# Patient Record
Sex: Female | Born: 1950 | Race: White | Hispanic: No | Marital: Married
Health system: Midwestern US, Community
[De-identification: ages and names within clinical notes are randomized; demographics above are authoritative.]

## PROBLEM LIST (undated history)

## (undated) DIAGNOSIS — F329 Major depressive disorder, single episode, unspecified: Secondary | ICD-10-CM

## (undated) DIAGNOSIS — I350 Nonrheumatic aortic (valve) stenosis: Secondary | ICD-10-CM

## (undated) DIAGNOSIS — K7581 Nonalcoholic steatohepatitis (NASH): Secondary | ICD-10-CM

## (undated) DIAGNOSIS — D509 Iron deficiency anemia, unspecified: Secondary | ICD-10-CM

## (undated) DIAGNOSIS — D61818 Other pancytopenia: Secondary | ICD-10-CM

## (undated) DIAGNOSIS — F32A Depression, unspecified: Secondary | ICD-10-CM

## (undated) DIAGNOSIS — R51 Headache: Secondary | ICD-10-CM

## (undated) DIAGNOSIS — I10 Essential (primary) hypertension: Secondary | ICD-10-CM

## (undated) DIAGNOSIS — M1990 Unspecified osteoarthritis, unspecified site: Secondary | ICD-10-CM

## (undated) DIAGNOSIS — E7800 Pure hypercholesterolemia, unspecified: Secondary | ICD-10-CM

## (undated) DIAGNOSIS — I8500 Esophageal varices without bleeding: Secondary | ICD-10-CM

## (undated) DIAGNOSIS — K766 Portal hypertension: Secondary | ICD-10-CM

## (undated) DIAGNOSIS — K5790 Diverticulosis of intestine, part unspecified, without perforation or abscess without bleeding: Secondary | ICD-10-CM

## (undated) DIAGNOSIS — E119 Type 2 diabetes mellitus without complications: Secondary | ICD-10-CM

## (undated) HISTORY — DX: Type 2 diabetes mellitus without complications: E11.9

## (undated) HISTORY — DX: Iron deficiency anemia, unspecified: D50.9

## (undated) HISTORY — PX: CHOLECYSTECTOMY: SHX55

## (undated) HISTORY — DX: Essential (primary) hypertension: I10

## (undated) HISTORY — DX: Nonalcoholic steatohepatitis (NASH): K75.81

## (undated) HISTORY — DX: Nonrheumatic aortic (valve) stenosis: I35.0

## (undated) HISTORY — PX: BREAST EXCISIONAL BIOPSY: SUR124

## (undated) HISTORY — DX: Other pancytopenia: D61.818

---

## 1990-04-01 HISTORY — PX: TONSILLECTOMY AND ADENOIDECTOMY: SUR1326

## 1991-04-02 HISTORY — PX: ABDOMINAL HYSTERECTOMY: SHX81

## 1996-04-01 HISTORY — PX: NASAL SEPTUM SURGERY: SHX37

## 2003-04-02 HISTORY — PX: SIGMOIDECTOMY: SHX176

## 2004-04-01 HISTORY — PX: HERNIA REPAIR: SHX51

## 2007-10-13 ENCOUNTER — Encounter: Admission: RE | Admit: 2007-10-13 | Discharge: 2007-10-13 | Payer: Self-pay | Admitting: Internal Medicine

## 2008-03-01 ENCOUNTER — Encounter: Admission: RE | Admit: 2008-03-01 | Discharge: 2008-03-01 | Payer: Self-pay | Admitting: Internal Medicine

## 2008-04-01 DIAGNOSIS — K7581 Nonalcoholic steatohepatitis (NASH): Secondary | ICD-10-CM

## 2008-04-01 HISTORY — DX: Nonalcoholic steatohepatitis (NASH): K75.81

## 2008-08-16 ENCOUNTER — Encounter: Admission: RE | Admit: 2008-08-16 | Discharge: 2008-08-16 | Payer: Self-pay | Admitting: Gastroenterology

## 2009-01-18 ENCOUNTER — Encounter: Admission: RE | Admit: 2009-01-18 | Discharge: 2009-01-18 | Payer: Self-pay | Admitting: Internal Medicine

## 2009-03-27 ENCOUNTER — Encounter: Admission: RE | Admit: 2009-03-27 | Discharge: 2009-03-27 | Payer: Self-pay | Admitting: Internal Medicine

## 2009-12-28 ENCOUNTER — Ambulatory Visit (HOSPITAL_COMMUNITY): Admission: RE | Admit: 2009-12-28 | Discharge: 2009-12-28 | Payer: Self-pay | Admitting: Gastroenterology

## 2010-12-06 ENCOUNTER — Encounter (INDEPENDENT_AMBULATORY_CARE_PROVIDER_SITE_OTHER): Payer: Self-pay | Admitting: Ophthalmology

## 2011-01-14 ENCOUNTER — Encounter (INDEPENDENT_AMBULATORY_CARE_PROVIDER_SITE_OTHER): Payer: Self-pay | Admitting: Ophthalmology

## 2011-02-27 ENCOUNTER — Other Ambulatory Visit: Payer: Self-pay | Admitting: Internal Medicine

## 2011-02-27 DIAGNOSIS — Z1231 Encounter for screening mammogram for malignant neoplasm of breast: Secondary | ICD-10-CM

## 2011-03-04 ENCOUNTER — Ambulatory Visit (INDEPENDENT_AMBULATORY_CARE_PROVIDER_SITE_OTHER): Payer: Managed Care, Other (non HMO) | Admitting: Ophthalmology

## 2011-03-04 DIAGNOSIS — H43819 Vitreous degeneration, unspecified eye: Secondary | ICD-10-CM

## 2011-03-04 DIAGNOSIS — E11319 Type 2 diabetes mellitus with unspecified diabetic retinopathy without macular edema: Secondary | ICD-10-CM

## 2011-03-04 DIAGNOSIS — H2510 Age-related nuclear cataract, unspecified eye: Secondary | ICD-10-CM

## 2011-03-04 DIAGNOSIS — E1139 Type 2 diabetes mellitus with other diabetic ophthalmic complication: Secondary | ICD-10-CM

## 2011-03-22 ENCOUNTER — Ambulatory Visit
Admission: RE | Admit: 2011-03-22 | Discharge: 2011-03-22 | Disposition: A | Discharge status: Home / Self Care | Payer: Private Health Insurance - Indemnity | Source: Ambulatory Visit | Attending: Internal Medicine | Admitting: Internal Medicine

## 2011-03-22 DIAGNOSIS — Z1231 Encounter for screening mammogram for malignant neoplasm of breast: Secondary | ICD-10-CM

## 2011-04-02 HISTORY — PX: COLONOSCOPY: SHX174

## 2011-10-17 ENCOUNTER — Other Ambulatory Visit: Payer: Self-pay | Admitting: Gastroenterology

## 2011-11-29 ENCOUNTER — Telehealth: Payer: Self-pay | Admitting: Hematology and Oncology

## 2011-11-29 NOTE — Telephone Encounter (Signed)
S/W pt in re NP appt 9/11 @ 1 W/Dr. Dalene Carrow  Referring Dr. Renford Dills Dx- Anemia NP packet mailed out.

## 2011-11-29 NOTE — Telephone Encounter (Signed)
C/D on 8/30 for appt 9/11 °

## 2011-12-03 ENCOUNTER — Telehealth: Payer: Self-pay | Admitting: Hematology and Oncology

## 2011-12-03 NOTE — Telephone Encounter (Signed)
Pt called and r/s appt due to another appt.

## 2011-12-10 ENCOUNTER — Encounter (HOSPITAL_COMMUNITY): Payer: Self-pay

## 2011-12-11 ENCOUNTER — Ambulatory Visit: Payer: Private Health Insurance - Indemnity

## 2011-12-11 ENCOUNTER — Ambulatory Visit: Payer: Private Health Insurance - Indemnity | Admitting: Oncology

## 2011-12-11 ENCOUNTER — Ambulatory Visit: Payer: Private Health Insurance - Indemnity | Admitting: Hematology and Oncology

## 2011-12-11 ENCOUNTER — Other Ambulatory Visit: Payer: Private Health Insurance - Indemnity | Admitting: Lab

## 2011-12-12 ENCOUNTER — Ambulatory Visit (HOSPITAL_COMMUNITY): Payer: Managed Care, Other (non HMO) | Admitting: Certified Registered Nurse Anesthetist

## 2011-12-12 ENCOUNTER — Encounter (HOSPITAL_COMMUNITY): Payer: Self-pay | Admitting: Certified Registered Nurse Anesthetist

## 2011-12-12 ENCOUNTER — Ambulatory Visit (HOSPITAL_COMMUNITY)
Admission: RE | Admit: 2011-12-12 | Discharge: 2011-12-12 | Disposition: A | Discharge status: Home / Self Care | Payer: Managed Care, Other (non HMO) | Source: Ambulatory Visit | Attending: Gastroenterology | Admitting: Gastroenterology

## 2011-12-12 ENCOUNTER — Encounter (HOSPITAL_COMMUNITY): Payer: Self-pay

## 2011-12-12 ENCOUNTER — Encounter (HOSPITAL_COMMUNITY)
Admission: RE | Disposition: A | Discharge status: Home / Self Care | Payer: Self-pay | Source: Ambulatory Visit | Attending: Gastroenterology

## 2011-12-12 DIAGNOSIS — D509 Iron deficiency anemia, unspecified: Secondary | ICD-10-CM | POA: Insufficient documentation

## 2011-12-12 DIAGNOSIS — E7800 Pure hypercholesterolemia, unspecified: Secondary | ICD-10-CM | POA: Insufficient documentation

## 2011-12-12 DIAGNOSIS — I8510 Secondary esophageal varices without bleeding: Secondary | ICD-10-CM | POA: Insufficient documentation

## 2011-12-12 DIAGNOSIS — Z79899 Other long term (current) drug therapy: Secondary | ICD-10-CM | POA: Insufficient documentation

## 2011-12-12 DIAGNOSIS — I10 Essential (primary) hypertension: Secondary | ICD-10-CM | POA: Insufficient documentation

## 2011-12-12 DIAGNOSIS — E119 Type 2 diabetes mellitus without complications: Secondary | ICD-10-CM | POA: Insufficient documentation

## 2011-12-12 DIAGNOSIS — D126 Benign neoplasm of colon, unspecified: Secondary | ICD-10-CM | POA: Insufficient documentation

## 2011-12-12 DIAGNOSIS — Z794 Long term (current) use of insulin: Secondary | ICD-10-CM | POA: Insufficient documentation

## 2011-12-12 DIAGNOSIS — K7460 Unspecified cirrhosis of liver: Secondary | ICD-10-CM | POA: Insufficient documentation

## 2011-12-12 DIAGNOSIS — M899 Disorder of bone, unspecified: Secondary | ICD-10-CM | POA: Insufficient documentation

## 2011-12-12 HISTORY — DX: Major depressive disorder, single episode, unspecified: F32.9

## 2011-12-12 HISTORY — DX: Unspecified osteoarthritis, unspecified site: M19.90

## 2011-12-12 HISTORY — DX: Headache: R51

## 2011-12-12 HISTORY — DX: Depression, unspecified: F32.A

## 2011-12-12 LAB — GLUCOSE, CAPILLARY
Glucose-Capillary: 88 mg/dL (ref 70–99)
Glucose-Capillary: 66 mg/dL — ABNORMAL LOW (ref 70–99)
Glucose-Capillary: 106 mg/dL — ABNORMAL HIGH (ref 70–99)

## 2011-12-12 SURGERY — COLONOSCOPY WITH PROPOFOL
Anesthesia: Monitor Anesthesia Care

## 2011-12-12 MED ORDER — ONDANSETRON HCL 4 MG/2ML IJ SOLN
INTRAMUSCULAR | Status: DC | PRN
  Administered 2011-12-12: 4 mg via INTRAVENOUS

## 2011-12-12 MED ORDER — PROPOFOL 10 MG/ML IV EMUL
INTRAVENOUS | Status: DC | PRN
  Administered 2011-12-12: 100 ug/kg/min via INTRAVENOUS

## 2011-12-12 MED ORDER — SODIUM CHLORIDE 0.9 % IV SOLN: INTRAVENOUS | Status: DC

## 2011-12-12 MED ORDER — LACTATED RINGERS IV SOLN
INTRAVENOUS | Status: DC
  Administered 2011-12-12: 1000 mL via INTRAVENOUS

## 2011-12-12 MED ORDER — LIDOCAINE HCL (CARDIAC) 20 MG/ML IV SOLN
INTRAVENOUS | Status: DC | PRN
  Administered 2011-12-12: 100 mg via INTRAVENOUS

## 2011-12-12 MED ORDER — MIDAZOLAM HCL 5 MG/5ML IJ SOLN
INTRAMUSCULAR | Status: DC | PRN
  Administered 2011-12-12: 1 mg via INTRAVENOUS

## 2011-12-12 MED ORDER — KETAMINE HCL 10 MG/ML IJ SOLN
INTRAMUSCULAR | Status: DC | PRN
  Administered 2011-12-12: 20 mg via INTRAVENOUS

## 2011-12-12 MED ORDER — FENTANYL CITRATE 0.05 MG/ML IJ SOLN
INTRAMUSCULAR | Status: DC | PRN
  Administered 2011-12-12 (×2): 50 ug via INTRAVENOUS

## 2011-12-12 SURGICAL SUPPLY — 24 items

## 2011-12-12 NOTE — Op Note (Signed)
Procedure: Surveillance colonoscopy.  Endoscopist: Danise Edge  Premedication: Propofol administered by anesthesia  Procedure: The patient was placed in the left lateral decubitus position. Anal inspection and digital rectal exam were normal. The Pentax pediatric colonoscope was introduced into the rectum and easily advanced to the cecum. Colonic preparation for the exam today was good.  Rectum. Normal. Sigmoid colon and descending colon. Remote sigmoid colectomy for diverticular disease. From the distal descending colon a 1 cm pedunculated polyp was removed with the hot snare. From the proximal descending colon a 5 mm sessile polyp was removed the hot snare. Splenic flexure. Normal. Transverse colon. From the proximal transverse colon a 4 mm sessile polyp was removed with the cold snare. Hepatic flexure. Normal. Ascending colon. Normal. Cecum and ileocecal valve. Normal.  Assessment: A 4 mm polyp was removed from the proximal transverse colon, a 5 mm polyp was removed from the proximal descending colon, a 1 cm pedunculated polyp was removed from the distal descending colon.  Recommendations: Schedule surveillance colonoscopy in 3 years.

## 2011-12-12 NOTE — H&P (Signed)
  Problem: History of colon polyps. Surveillance colonoscopy is scheduled today.  History: The patient is a 61 year old female who underwent a colonoscopy with removal of adenomatous colon polyps with  villous component in 2010. She is scheduled to undergo a surveillance colonoscopy today.  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 She takes nadalol to prevent variceal bleeding. She has never had a documented variceal bleed.  Medication allergies: None.  Past medical and surgical history: Type 2 diabetes mellitus. Hypertension. Hypercholesterolemia. Osteopenia. Iron deficiency anemia. Sigmoid colectomy to treat diverticular disease. Vitamin D. deficiency. History of colon polyps. 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 Sigmoid colectomy. Cholecystectomy. Hysterectomy. Tonsillectomy. Umbilical hernia surgery. Sinus surgery.  Habits: The patient does not smoke cigarettes or consume alcohol.  Chronic medications: Fosamax. NovoLog insulin. Lantus insulin. Lactulose. Protonix. Phenergan. Crestor. Diovan. Effexor.  Exam: The patient is alert and lying comfortably on the endoscopy stretcher. Sclera are nonicteric. Cardiac exam reveals a regular rhythm. Lungs are clear to auscultation. Abdomen is soft, flat, and nontender to palpation in all quadrants.  Plan: Proceed with surveillance colonoscopy with polypectomy to prevent colon cancer.

## 2011-12-12 NOTE — Transfer of Care (Signed)
Immediate Anesthesia Transfer of Care Note  Patient: Jillian Sanders  Procedure(s) Performed: Procedure(s) (LRB): COLONOSCOPY WITH PROPOFOL (N/A)  Patient Location: PACU  Anesthesia Type: MAC  Level of Consciousness: sedated, patient cooperative and responds to stimulaton  Airway & Oxygen Therapy: Patient Spontanous Breathing and Patient connected to face mask oxgen  Post-op Assessment: Report given to PACU RN and Post -op Vital signs reviewed and stable  Post vital signs: Reviewed and stable  Complications: No apparent anesthesia complications

## 2011-12-12 NOTE — Progress Notes (Signed)
Patient given graham crackers and regular coke for low blood sugar prior to discharge blood sugar was 106.

## 2011-12-12 NOTE — Preoperative (Signed)
Beta Blockers   Reason not to administer Beta Blockers:nadolol Taken at 12-12-11 at 2200

## 2011-12-12 NOTE — Anesthesia Postprocedure Evaluation (Signed)
  Anesthesia Post-op Note  Patient: Jillian Sanders  Procedure(s) Performed: Procedure(s) (LRB): COLONOSCOPY WITH PROPOFOL (N/A)  Patient Location: PACU  Anesthesia Type: MAC  Level of Consciousness: awake and alert   Airway and Oxygen Therapy: Patient Spontanous Breathing  Post-op Pain: mild  Post-op Assessment: Post-op Vital signs reviewed, Patient's Cardiovascular Status Stable, Respiratory Function Stable, Patent Airway and No signs of Nausea or vomiting  Post-op Vital Signs: stable  Complications: No apparent anesthesia complications

## 2011-12-12 NOTE — Anesthesia Preprocedure Evaluation (Addendum)
Anesthesia Evaluation  Patient identified by MRN, date of birth, ID band Patient awake    Reviewed: Allergy & Precautions, H&P , NPO status , Patient's Chart, lab work & pertinent test results  History of Anesthesia Complications (+) AWARENESS UNDER ANESTHESIA  Airway Mallampati: II TM Distance: <3 FB Neck ROM: Full    Dental No notable dental hx.    Pulmonary neg pulmonary ROS,  breath sounds clear to auscultation  Pulmonary exam normal       Cardiovascular hypertension, Pt. on medications negative cardio ROS  Rhythm:Regular Rate:Normal     Neuro/Psych negative neurological ROS  negative psych ROS   GI/Hepatic negative GI ROS, Neg liver ROS, GERD-  Medicated,  Endo/Other  negative endocrine ROSdiabetes, Insulin Dependent  Renal/GU negative Renal ROS  negative genitourinary   Musculoskeletal negative musculoskeletal ROS (+)   Abdominal   Peds negative pediatric ROS (+)  Hematology negative hematology ROS (+)   Anesthesia Other Findings   Reproductive/Obstetrics negative OB ROS                         Anesthesia Physical Anesthesia Plan  ASA: III  Anesthesia Plan: MAC   Post-op Pain Management:    Induction: Intravenous  Airway Management Planned: Nasal Cannula  Additional Equipment:   Intra-op Plan:   Post-operative Plan:   Informed Consent: I have reviewed the patients History and Physical, chart, labs and discussed the procedure including the risks, benefits and alternatives for the proposed anesthesia with the patient or authorized representative who has indicated his/her understanding and acceptance.     Plan Discussed with: CRNA and Surgeon  Anesthesia Plan Comments:         Anesthesia Quick Evaluation

## 2011-12-17 ENCOUNTER — Ambulatory Visit (HOSPITAL_BASED_OUTPATIENT_CLINIC_OR_DEPARTMENT_OTHER): Payer: Managed Care, Other (non HMO) | Admitting: Hematology and Oncology

## 2011-12-17 ENCOUNTER — Telehealth: Payer: Self-pay | Admitting: *Deleted

## 2011-12-17 ENCOUNTER — Ambulatory Visit (HOSPITAL_BASED_OUTPATIENT_CLINIC_OR_DEPARTMENT_OTHER): Payer: Managed Care, Other (non HMO)

## 2011-12-17 ENCOUNTER — Telehealth: Payer: Self-pay | Admitting: Hematology and Oncology

## 2011-12-17 ENCOUNTER — Ambulatory Visit: Payer: Managed Care, Other (non HMO)

## 2011-12-17 ENCOUNTER — Encounter: Payer: Self-pay | Admitting: Hematology and Oncology

## 2011-12-17 VITALS — BP 145/73 | HR 91 | Temp 98.2°F | Resp 20 | Ht 65.5 in | Wt 184.4 lb

## 2011-12-17 DIAGNOSIS — D539 Nutritional anemia, unspecified: Secondary | ICD-10-CM

## 2011-12-17 DIAGNOSIS — D509 Iron deficiency anemia, unspecified: Secondary | ICD-10-CM | POA: Insufficient documentation

## 2011-12-17 DIAGNOSIS — K7689 Other specified diseases of liver: Secondary | ICD-10-CM

## 2011-12-17 DIAGNOSIS — R5383 Other fatigue: Secondary | ICD-10-CM

## 2011-12-17 LAB — CBC WITH DIFFERENTIAL/PLATELET
BASO%: 0.7 % (ref 0.0–2.0)
Basophils Absolute: 0 10*3/uL (ref 0.0–0.1)
EOS%: 3.2 % (ref 0.0–7.0)
HCT: 33.5 % — ABNORMAL LOW (ref 34.8–46.6)
HGB: 11.5 g/dL — ABNORMAL LOW (ref 11.6–15.9)
MCH: 30.2 pg (ref 25.1–34.0)
MONO#: 0.2 10*3/uL (ref 0.1–0.9)
NEUT#: 1.6 10*3/uL (ref 1.5–6.5)
NEUT%: 58.4 % (ref 38.4–76.8)
RDW: 14 % (ref 11.2–14.5)
WBC: 2.8 10*3/uL — ABNORMAL LOW (ref 3.9–10.3)
lymph#: 0.9 10*3/uL (ref 0.9–3.3)

## 2011-12-17 LAB — COMPREHENSIVE METABOLIC PANEL (CC13)
Alkaline Phosphatase: 179 U/L — ABNORMAL HIGH (ref 40–150)
BUN: 8 mg/dL (ref 7.0–26.0)
Creatinine: 0.8 mg/dL (ref 0.6–1.1)
Glucose: 194 mg/dl — ABNORMAL HIGH (ref 70–99)
Sodium: 139 mEq/L (ref 136–145)
Total Bilirubin: 1.5 mg/dL — ABNORMAL HIGH (ref 0.20–1.20)
Total Protein: 6.8 g/dL (ref 6.4–8.3)

## 2011-12-17 LAB — URINALYSIS, MICROSCOPIC - CHCC
Ketones: NEGATIVE mg/dL
Leukocyte Esterase: NEGATIVE
Protein: NEGATIVE mg/dL
RBC / HPF: NEGATIVE (ref 0–2)

## 2011-12-17 LAB — TECHNOLOGIST REVIEW

## 2011-12-17 NOTE — Progress Notes (Signed)
Dr.   Renford Dills       -     Primary. Dr.   Danise Edge       -     GI Dr.   [name]     -      Herpetologist  @   [facility].  [facility]   In    [place].   [phone].  Cell    Phone      [phone].

## 2011-12-17 NOTE — Progress Notes (Signed)
Checked in new pt with no financial concerns. °

## 2011-12-17 NOTE — Telephone Encounter (Signed)
Dr. Georges Mouse pt advised and will receive 9.18 with appt.

## 2011-12-17 NOTE — Telephone Encounter (Signed)
Called pt at home and left message on voice mail re:  appt for Feraheme on 12/20/11 at  315 pm.   Asked pt to call nurse back to confirm that she got message.

## 2011-12-17 NOTE — Progress Notes (Signed)
This office note has been dictated.

## 2011-12-17 NOTE — Telephone Encounter (Signed)
Per staff message and POf I have scheduled appts.  JMW  

## 2011-12-17 NOTE — Telephone Encounter (Signed)
Printed and made apt for pt..e-mailed Michelle to add 9.18 and 10.15 tx

## 2011-12-17 NOTE — Patient Instructions (Signed)
Pama Wessinger  [id number]   [facility] - AFTER VISIT SUMMARY   **RECOMMENDATIONS MADE BY THE CONSULTANT AND ANY TEST    RESULTS WILL BE SENT TO YOUR REFERRING DOCTORS.   YOUR EXAM FINDINGS, LABS AND RESULTS WERE DISCUSSED BY YOUR MD TODAY.  YOU CAN GO TO THE  WEB SITE FOR INSTRUCTIONS ON HOW TO ASSESS MY CHART FOR ADDITIONAL INFORMATION AS NEEDED.  Your Updated drug allergies are: Allergies as of 12/17/2011 - Review Complete 12/17/2011  Allergen Reaction Noted  . Valium (diazepam)  12/10/2011    Your current list of medications are: Current Outpatient Prescriptions  Medication Sig Dispense Refill  . alendronate (FOSAMAX) 70 MG tablet Take 70 mg by mouth every 7 (seven) days. Take with a full glass of water on an empty stomach.      . insulin aspart (NOVOLOG) 100 UNIT/ML injection Inject into the skin 3 (three) times daily before meals.      . insulin glargine (LANTUS) 100 UNIT/ML injection Inject 50 Units into the skin 2 (two) times daily.      . Lactulose SOLN 30 mLs by Does not apply route 2 (two) times daily.      . nadolol (CORGARD) 40 MG tablet Take 40 mg by mouth daily.      . pantoprazole (PROTONIX) 40 MG tablet Take 40 mg by mouth daily.      . rifaximin (XIFAXAN) 550 MG TABS Take 550 mg by mouth 2 (two) times daily.      . rosuvastatin (CRESTOR) 10 MG tablet Take 10 mg by mouth 1 day or 1 dose.      . valsartan (DIOVAN) 320 MG tablet Take 320 mg by mouth daily.      Marland Kitchen venlafaxine XR (EFFEXOR-XR) 75 MG 24 hr capsule Take 75 mg by mouth daily.         INSTRUCTIONS GIVEN AND DISCUSSED:  See attached schedule   SPECIAL INSTRUCTIONS/FOLLOW-UP:  See above.  I acknowledge that I have been informed and understand all the instructions given to me and received a copy.I know to contact the clinic, my physician, or go to the emergency Department if any problems should occur.   I do not have any more questions at this time, but understand that I may call  the [facility] at [phone] during business hours should I have any further questions or need assistance in obtaining follow-up care.

## 2011-12-18 ENCOUNTER — Ambulatory Visit: Payer: Managed Care, Other (non HMO)

## 2011-12-18 ENCOUNTER — Other Ambulatory Visit: Payer: Self-pay | Admitting: Radiology

## 2011-12-18 NOTE — Progress Notes (Signed)
CC:   Jillian Sanders, M.D. Jillian Sanders. Sanders, M.D. Jillian Copa, MD, Fax [phone]  IDENTIFYING STATEMENT:  The patient is a 61 year old woman seen at the request of Dr. Nehemiah Sanders with anemia.  HISTORY OF PRESENT ILLNESS:  The patient recalls that she was seen by a hematologist in [place] until May of this year.  She also recalls that she was diagnosed with iron deficiency anemia in February 2010.  She had presented with profound epistaxis.  Hemoglobin had decreased to 5.  She required several units of packed red blood cells.  The patient recalls that since that time she has required periodic IV iron in the form of Feraheme and was last infused in May of 2013.  I did not have her records from [place], [place].  She does however note increasing fatigue and is craving ice.  She is able to perform most activities of daily living, albeit slower.  Denies overt blood loss specifically denying hematemesis, hemoptysis, epistaxis and rectal bleeding.  She informs me that her GI physician is Dr. Danise Sanders, and he performed a colonoscopy as recently as last month which she reports as being unremarkable.  She also informs me that she received endoscopy last week which she reports as also being unremarkable.  Of note, she is being followed for portal hypertension.  Has a history for nonalcoholic fatty liver with cirrhosis diagnosed in December of 2013.  She is currently being medically managed by Dr. Cornelia Sanders at [facility].  She has never received a small bowel exam.  PAST MEDICAL HISTORY: 1. Insulin requiring diabetes. 2. Cirrhosis secondary to nonalcoholic fatty liver. 3. Depression. 4. History of heart murmur. 5. Status post hernia repair in 2006. 6. Sigmoidectomy. 7. Status post abdominal hysterectomy. 8. GERD. 9. Hypertension. 10.History of epistaxis. 11.Status post cholecystectomy in 1998. 12.Status post tonsillectomy and adenectomy. 13.Status post sinus  surgery. 14.History of osteoporosis.  ALLERGIES:  Valium.  MEDICATIONS: 1. Fosamax 70 mg weekly. 2. NovoLog insulin as needed. 3. Lantus 50 units in the a.m. and 50 units in p.m. 4. Lactulose 30 mL daily. 5. Protonix 40 mg daily. 6. Phenergan 25 mg q.6 as needed. 7. Crestor 10 mg q.h.s. 8. Diovan 320 mg daily. 9. Effexor XR 75 mg daily. 10.Rifaximin 550 mg 2 times daily.  SOCIAL HISTORY:  The patient quit smoking in 1979.  Smoked 1 pack a day for 10 years.  She denies alcohol use.  She is married with 2 grown children.  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  FAMILY HISTORY:  No family history for hematologic or oncologic malignancies.  REVIEW OF SYSTEMS:  Denies fever, chills, night sweats, anorexia, weight loss.  GI:  Denies nausea, vomiting, abdominal pain, diarrhea, melena, hematochezia.  GU:  Denies dysuria, hematuria, nocturia, frequency. Cardiovascular:  Denies chest pain, PND, orthopnea, ankle swelling. Respiratory:  Denies cough, hemoptysis, wheeze, shortness of breath. Musculoskeletal:  Denies joint aches, muscle pains.  Skin:  Notes minimal alopecia.  No bruising.  Rest of review of systems negative.  PHYSICAL EXAM:  General:  The patient is alert and oriented x3.  Vitals: Pulse 91, blood pressure 145/73, temperature 98.2, respirations 20, weight 184 pounds.  HEENT:  Head is atraumatic, normocephalic.  Sclerae anicteric.  Mouth moist.  Neck:  Supple.  Chest:  Clear to percussion and auscultation.  CVS:  First and second heart sounds present.  No added sounds or murmurs.  Abdomen:  Soft, nontender.  No masses.  Bowel sounds present.  Extremities:  No edema.  Pulses present and symmetrical.  Skin:  Spider angioma but no palmar erythema. Musculoskeletal:  Denies joint aches, muscle pains.  CNS:  Nonfocal.  IMPRESSION AND PLAN:  Jillian Sanders is a 61 year old woman with: 1. History of iron deficiency anemia.  Etiology undetermined; although     does have history  of NASH (nonalcoholic steatohepatitis) with     portal hypertension.  Has received several IV iron infusions at an     outside facility in [place], [place].  Last infused in May of     2013.  History is suggestive of fatigue and pica for ice.  I would     like for her to return to lab.  We will obtain anemia panel.  We     will also have her obtain a CBC with diff and comprehensive     metabolic panel.  Further anemia workup shall include ruling out     hemolysis with [place] and haptoglobin with LDH.  We will review     morphology.  We will rule out plasma cell dyscrasia with an SPEP     and ISE.  We will obtain urinalysis.  I also recommended that she     needs a bone marrow evaluation which we will schedule with     intervention radiology.  She also reports or recalls that she has     never had a small bowel exam.  She will touch base with Dr. Danise Sanders.  If we confirm low iron levels, we will invite her back to     receive iron in the form of Feraheme, which she has received in the     past.  I did however discuss logistics and side effects of therapy     which is primarily infusional.  Thereafter, she follows up in 4-6     weeks' time with blood work. 2. History of NASH (nonalcoholic steatohepatitis), followed at [facility].  I spent more than half the time coordinating care.    ______________________________ [name], M.D. LIO/MEDQ  D:  12/17/2011  T:  12/17/2011  Job:  [id number]

## 2011-12-19 ENCOUNTER — Encounter (HOSPITAL_COMMUNITY): Payer: Self-pay | Admitting: Pharmacy Technician

## 2011-12-19 ENCOUNTER — Other Ambulatory Visit: Payer: Self-pay | Admitting: *Deleted

## 2011-12-19 LAB — PROTEIN ELECTROPHORESIS, SERUM
Albumin ELP: 53.1 % — ABNORMAL LOW (ref 55.8–66.1)
Alpha-1-Globulin: 5.7 % — ABNORMAL HIGH (ref 2.9–4.9)
Alpha-2-Globulin: 9.8 % (ref 7.1–11.8)
Beta Globulin: 6.4 % (ref 4.7–7.2)
Total Protein, Serum Electrophoresis: 6.6 g/dL (ref 6.0–8.3)

## 2011-12-19 LAB — IRON AND TIBC
%SAT: 17 % — ABNORMAL LOW (ref 20–55)
Iron: 61 ug/dL (ref 42–145)
TIBC: 359 ug/dL (ref 250–470)
UIBC: 298 ug/dL (ref 125–400)

## 2011-12-19 LAB — HAPTOGLOBIN: Haptoglobin: 31 mg/dL — ABNORMAL LOW (ref 45–215)

## 2011-12-19 LAB — DIRECT ANTIGLOBULIN TEST (NOT AT ARMC)
DAT (Complement): NEGATIVE
DAT IgG: NEGATIVE

## 2011-12-19 LAB — FOLATE: Folate: 12 ng/mL

## 2011-12-20 ENCOUNTER — Other Ambulatory Visit: Payer: Self-pay | Admitting: Hematology and Oncology

## 2011-12-20 ENCOUNTER — Ambulatory Visit (HOSPITAL_BASED_OUTPATIENT_CLINIC_OR_DEPARTMENT_OTHER): Payer: Managed Care, Other (non HMO)

## 2011-12-20 VITALS — BP 136/73 | HR 69 | Temp 98.0°F | Resp 20

## 2011-12-20 DIAGNOSIS — D509 Iron deficiency anemia, unspecified: Secondary | ICD-10-CM

## 2011-12-20 DIAGNOSIS — D539 Nutritional anemia, unspecified: Secondary | ICD-10-CM

## 2011-12-20 MED ORDER — SODIUM CHLORIDE 0.9 % IV SOLN
Freq: Once | INTRAVENOUS | Status: AC
  Administered 2011-12-20: 16:00:00 via INTRAVENOUS

## 2011-12-20 MED ORDER — SODIUM CHLORIDE 0.9 % IV SOLN
1020.0000 mg | Freq: Once | INTRAVENOUS | Status: AC
  Administered 2011-12-20: 1020 mg via INTRAVENOUS
  Filled 2011-12-20: qty 34

## 2011-12-20 NOTE — Progress Notes (Signed)
Patient states she had Feraheme in May in [place], [place].

## 2011-12-20 NOTE — Patient Instructions (Signed)
Ferumoxytol injection What is this medicine? FERUMOXYTOL is an iron complex. Iron is used to make healthy red blood cells, which carry oxygen and nutrients throughout the body. This medicine is used to treat iron deficiency anemia in people with chronic kidney disease. This medicine may be used for other purposes; ask your health care provider or pharmacist if you have questions. What should I tell my health care provider before I take this medicine? They need to know if you have any of these conditions: -anemia not caused by low iron levels -high levels of iron in the blood -magnetic resonance imaging (MRI) test scheduled -an unusual or allergic reaction to iron, other medicines, foods, dyes, or preservatives -pregnant or trying to get pregnant -breast-feeding How should I use this medicine? This medicine is for infusion into a vein. It is given by a health care professional in a hospital or clinic setting. Talk to your pediatrician regarding the use of this medicine in children. Special care may be needed. Overdosage: If you think you've taken too much of this medicine contact a poison control center or emergency room at once. Overdosage: If you think you have taken too much of this medicine contact a poison control center or emergency room at once. NOTE: This medicine is only for you. Do not share this medicine with others. What if I miss a dose? It is important not to miss your dose. Call your doctor or health care professional if you are unable to keep an appointment. What may interact with this medicine? This medicine may interact with the following medications: -other iron products This list may not describe all possible interactions. Give your health care provider a list of all the medicines, herbs, non-prescription drugs, or dietary supplements you use. Also tell them if you smoke, drink alcohol, or use illegal drugs. Some items may interact with your medicine. What should I watch  for while using this medicine? Visit your doctor or healthcare professional regularly. Tell your doctor or healthcare professional if your symptoms do not start to get better or if they get worse. You may need blood work done while you are taking this medicine. You may need to follow a special diet. Talk to your doctor. Foods that contain iron include: whole grains/cereals, dried fruits, beans, or peas, leafy green vegetables, and organ meats (liver, kidney). What side effects may I notice from receiving this medicine? Side effects that you should report to your doctor or health care professional as soon as possible: -allergic reactions like skin rash, itching or hives, swelling of the face, lips, or tongue -breathing problems -changes in blood pressure -feeling faint or lightheaded, falls -fever or chills -flushing, sweating, or hot feelings -swelling of the ankles or feet Side effects that usually do not require medical attention (Report these to your doctor or health care professional if they continue or are bothersome.): -diarrhea -headache -nausea, vomiting -stomach pain This list may not describe all possible side effects. Call your doctor for medical advice about side effects. You may report side effects to FDA at 1-800-FDA-1088. Where should I keep my medicine? This drug is given in a hospital or clinic and will not be stored at home. NOTE: This sheet is a summary. It may not cover all possible information. If you have questions about this medicine, talk to your doctor, pharmacist, or health care provider.  2012, Elsevier/Gold Standard. (12/09/2007 9:48:25 PM) 

## 2011-12-23 ENCOUNTER — Ambulatory Visit (HOSPITAL_COMMUNITY)
Admission: RE | Admit: 2011-12-23 | Discharge: 2011-12-23 | Disposition: A | Discharge status: Home / Self Care | Payer: Managed Care, Other (non HMO) | Source: Ambulatory Visit | Attending: Hematology and Oncology | Admitting: Hematology and Oncology

## 2011-12-23 ENCOUNTER — Inpatient Hospital Stay (HOSPITAL_COMMUNITY): Admission: RE | Admit: 2011-12-23 | Payer: Managed Care, Other (non HMO) | Source: Ambulatory Visit

## 2011-12-23 ENCOUNTER — Encounter (HOSPITAL_COMMUNITY): Payer: Self-pay

## 2011-12-23 ENCOUNTER — Other Ambulatory Visit (HOSPITAL_COMMUNITY): Payer: Managed Care, Other (non HMO)

## 2011-12-23 DIAGNOSIS — Z87891 Personal history of nicotine dependence: Secondary | ICD-10-CM | POA: Insufficient documentation

## 2011-12-23 DIAGNOSIS — E119 Type 2 diabetes mellitus without complications: Secondary | ICD-10-CM | POA: Insufficient documentation

## 2011-12-23 DIAGNOSIS — I10 Essential (primary) hypertension: Secondary | ICD-10-CM | POA: Insufficient documentation

## 2011-12-23 DIAGNOSIS — K219 Gastro-esophageal reflux disease without esophagitis: Secondary | ICD-10-CM | POA: Insufficient documentation

## 2011-12-23 DIAGNOSIS — D649 Anemia, unspecified: Secondary | ICD-10-CM | POA: Insufficient documentation

## 2011-12-23 DIAGNOSIS — D539 Nutritional anemia, unspecified: Secondary | ICD-10-CM

## 2011-12-23 DIAGNOSIS — Z794 Long term (current) use of insulin: Secondary | ICD-10-CM | POA: Insufficient documentation

## 2011-12-23 DIAGNOSIS — K7689 Other specified diseases of liver: Secondary | ICD-10-CM | POA: Insufficient documentation

## 2011-12-23 DIAGNOSIS — D61818 Other pancytopenia: Secondary | ICD-10-CM | POA: Insufficient documentation

## 2011-12-23 DIAGNOSIS — Z79899 Other long term (current) drug therapy: Secondary | ICD-10-CM | POA: Insufficient documentation

## 2011-12-23 HISTORY — DX: Pure hypercholesterolemia, unspecified: E78.00

## 2011-12-23 HISTORY — DX: Esophageal varices without bleeding: K76.6

## 2011-12-23 HISTORY — DX: Esophageal varices without bleeding: I85.00

## 2011-12-23 HISTORY — DX: Diverticulosis of intestine, part unspecified, without perforation or abscess without bleeding: K57.90

## 2011-12-23 LAB — CBC
HCT: 31.8 % — ABNORMAL LOW (ref 36.0–46.0)
MCH: 30.6 pg (ref 26.0–34.0)
MCV: 88.3 fL (ref 78.0–100.0)
Platelets: 80 10*3/uL — ABNORMAL LOW (ref 150–400)
RBC: 3.6 MIL/uL — ABNORMAL LOW (ref 3.87–5.11)

## 2011-12-23 LAB — GLUCOSE, CAPILLARY

## 2011-12-23 MED ORDER — MIDAZOLAM HCL 2 MG/2ML IJ SOLN
INTRAMUSCULAR | Status: AC
  Filled 2011-12-23: qty 4

## 2011-12-23 MED ORDER — FENTANYL CITRATE 0.05 MG/ML IJ SOLN
INTRAMUSCULAR | Status: AC
  Filled 2011-12-23: qty 4

## 2011-12-23 MED ORDER — MIDAZOLAM HCL 5 MG/5ML IJ SOLN
INTRAMUSCULAR | Status: AC | PRN
  Administered 2011-12-23: 2 mg via INTRAVENOUS

## 2011-12-23 MED ORDER — SODIUM CHLORIDE 0.9 % IV SOLN
Freq: Once | INTRAVENOUS | Status: AC
  Administered 2011-12-23: 09:00:00 via INTRAVENOUS

## 2011-12-23 MED ORDER — FENTANYL CITRATE 0.05 MG/ML IJ SOLN
INTRAMUSCULAR | Status: AC | PRN
  Administered 2011-12-23: 100 ug via INTRAVENOUS

## 2011-12-23 MED ORDER — LORAZEPAM 2 MG/ML IJ SOLN
INTRAMUSCULAR | Status: AC
  Filled 2011-12-23: qty 1

## 2011-12-23 NOTE — Procedures (Signed)
Technically successful CT guided bone marrow aspiration and biopsy of left iliac crest. No immediate complications. Awaiting pathology report.    

## 2011-12-23 NOTE — H&P (Signed)
Jillian Sanders is an 61 y.o. female.   Chief Complaint: Anemia.  Patient presents today for Bone Marrow Needle Core biopsy.  HPI: see oncology note below :    [name], MD Physician Signed  Progress Notes 12/17/2011 4:03 PM  Related encounter: Office Visit from 12/17/2011 in [facility]  CC:   Danise Edge, M.D. [name], M.D. [name], MD, Fax [phone]  IDENTIFYING STATEMENT:  The patient is a 61 year old woman seen at the request of Dr. Nehemiah Settle with anemia.  HISTORY OF PRESENT ILLNESS:  The patient recalls that she was seen by a hematologist in [place] until May of this year.  She also recalls that she was diagnosed with iron deficiency anemia in February 2010.  She had presented with profound epistaxis.  Hemoglobin had decreased to 5.  She required several units of packed red blood cells.  The patient recalls that since that time she has required periodic IV iron in the form of Feraheme and was last infused in May of 2013.  I did not have her records from [place], [place].  She does however note increasing fatigue and is craving ice.  She is able to perform most activities of daily living, albeit slower.  Denies overt blood loss specifically denying hematemesis, hemoptysis, epistaxis and rectal bleeding.  She informs me that her GI physician is Dr. Danise Edge, and he performed a colonoscopy as recently as last month which she reports as being unremarkable.  She also informs me that she received endoscopy last week which she reports as also being unremarkable.  Of note, she is being followed for portal hypertension.  Has a history for nonalcoholic fatty liver with cirrhosis diagnosed in December of 2013.  She is currently being medically managed by Dr. [name] at [facility].  She has never received a small bowel exam.  PAST MEDICAL HISTORY: 1. Insulin requiring diabetes. 2. 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 3. Depression. 4. History of heart murmur. 5. Status post hernia repair in 2006. 6. Sigmoidectomy. 7. Status post abdominal hysterectomy. 8. GERD. 9. Hypertension. 10.History of epistaxis. 11.Status post cholecystectomy in 1998. 12.Status post tonsillectomy and adenectomy. 13.Status post sinus surgery. 14.History of osteoporosis.  ALLERGIES:  Valium.  MEDICATIONS: 1. Fosamax 70 mg weekly. 2. NovoLog insulin as needed. 3. Lantus 50 units in the a.m. and 50 units in p.m. 4. Lactulose 30 mL daily. 5. Protonix 40 mg daily. 6. Phenergan 25 mg q.6 as needed. 7. Crestor 10 mg q.h.s. 8. Diovan 320 mg daily. 9. Effexor XR 75 mg daily. 10.Rifaximin 550 mg 2 times daily.  SOCIAL HISTORY:  The patient quit smoking in 1979.  Smoked 1 pack a day for 10 years.  She denies alcohol use.  She is married with 2 grown children.  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  FAMILY HISTORY:  No family history for hematologic or oncologic malignancies.  REVIEW OF SYSTEMS:  Denies fever, chills, night sweats, anorexia, weight loss.  GI:  Denies nausea, vomiting, abdominal pain, diarrhea, melena, hematochezia.  GU:  Denies dysuria, hematuria, nocturia, frequency. Cardiovascular:  Denies chest pain, PND, orthopnea, ankle swelling. Respiratory:  Denies cough, hemoptysis, wheeze, shortness of breath. Musculoskeletal:  Denies joint aches, muscle pains.  Skin:  Notes minimal alopecia.  No bruising.  Rest of review of systems negative.  PHYSICAL EXAM:  General:  The patient is alert and oriented x3.  Vitals: Pulse 91, blood pressure 145/73, temperature 98.2, respirations  20, weight 184 pounds.  HEENT:  Head is atraumatic, normocephalic.  Sclerae anicteric.  Mouth moist.  Neck:  Supple.  Chest:  Clear to percussion and auscultation.  CVS:  First and second heart sounds present.  No added sounds or murmurs.  Abdomen:  Soft, nontender.  No masses.  Bowel sounds  present.  Extremities:  No edema.  Pulses present and symmetrical.  Skin:  Spider angioma but no palmar erythema. Musculoskeletal:  Denies joint aches, muscle pains.  CNS:  Nonfocal.  IMPRESSION AND PLAN:  Ms. Butterly is a 61 year old woman with: 1. History of iron deficiency anemia.  Etiology undetermined; although     does have history of NASH (nonalcoholic steatohepatitis) with     portal hypertension.  Has received several IV iron infusions at an     outside facility in [place], [place].  Last infused in May of     2013.  History is suggestive of fatigue and pica for ice.  I would     like for her to return to lab.  We will obtain anemia panel.  We     will also have her obtain a CBC with diff and comprehensive     metabolic panel.  Further anemia workup shall include ruling out     hemolysis with [place] and haptoglobin with LDH.  We will review     morphology.  We will rule out plasma cell dyscrasia with an SPEP     and ISE.  We will obtain urinalysis.  I also recommended that she     needs a bone marrow evaluation which we will schedule with     intervention radiology.  She also reports or recalls that she has     never had a small bowel exam.  She will touch base with Dr. Danise Edge.  If we confirm low iron levels, we will invite her back to     receive iron in the form of Feraheme, which she has received in the     past.  I did however discuss logistics and side effects of therapy     which is primarily infusional.  Thereafter, she follows up in 4-6     weeks' time with blood work. 2. History of NASH (nonalcoholic steatohepatitis), followed at [facility].  I spent more than half the time coordinating care.    ______________________________ [name], M.D. LIO/MEDQ  D:  12/17/2011  T:  12/17/2011  Job:  [id number]   Today's examination findings :   Past Medical History  Diagnosis Date  . Diabetes mellitus   . Depression   . Heart murmur   .  Headache   . Arthritis   . Anemia     Fe deficiency  . Cirrhosis of liver not due to alcohol 2010    NASH  . Esophageal varices   . Hypertension   . Hypercholesteremia   . Diverticular disease     s/p sigmoid colectomy  . Portal hypertension with esophageal varices     Past Surgical History  Procedure Date  . Hernia repair 2006  . Sigmoidectomy 2005  . Nasal septum surgery 1998  . Tonsillectomy and adenoidectomy 1992  . Abdominal hysterectomy 1993  . Cholecystectomy   . Colonoscopy 2013    polp removal    No family history on file. Social History:  reports that she quit smoking about 34 years ago. Her smoking use included Cigarettes. She has a 10 pack-year smoking history.  She does not have any smokeless tobacco history on file. She reports that she does not drink alcohol or use illicit drugs.  Allergies:  Allergies  Allergen Reactions  . Valium (Diazepam)     causes hyperactivity     Medication List     As of 12/23/2011  9:44 AM    ASK your doctor about these medications         alendronate 70 MG tablet   Commonly known as: FOSAMAX   Take 70 mg by mouth every 7 (seven) days. Take with a full glass of water on an empty stomach.      insulin aspart 100 UNIT/ML injection   Commonly known as: novoLOG   Inject 5-30 Units into the skin 3 (three) times daily before meals. 150-200=5units 200-250=10units 250-300=15units 300-350=20units 350-400=25units 400-450=30units      insulin glargine 100 UNIT/ML injection   Commonly known as: LANTUS   Inject 50 Units into the skin 2 (two) times daily.      Lactulose Soln   Take 30 mLs by mouth daily.      nadolol 40 MG tablet   Commonly known as: CORGARD   Take 40 mg by mouth every evening.      pantoprazole 40 MG tablet   Commonly known as: PROTONIX   Take 40 mg by mouth daily.      promethazine 25 MG tablet   Commonly known as: PHENERGAN   Take 25 mg by mouth every 6 (six) hours as needed. Nausea      rifaximin 550  MG Tabs   Commonly known as: XIFAXAN   Take 550 mg by mouth 2 (two) times daily.      rosuvastatin 10 MG tablet   Commonly known as: CRESTOR   Take 10 mg by mouth every evening.      valsartan 320 MG tablet   Commonly known as: DIOVAN   Take 320 mg by mouth daily with breakfast.      venlafaxine XR 75 MG 24 hr capsule   Commonly known as: EFFEXOR-XR   Take 75 mg by mouth daily with breakfast.         Lab results :   Results for orders placed during the hospital encounter of 12/23/11 (from the past 48 hour(s))  APTT     Status: Normal   Collection Time   12/23/11  8:40 AM      Component Value Range Comment   aPTT 35  24 - 37 seconds   CBC     Status: Abnormal   Collection Time   12/23/11  8:40 AM      Component Value Range Comment   WBC 3.7 (*) 4.0 - 10.5 K/uL    RBC 3.60 (*) 3.87 - 5.11 MIL/uL    Hemoglobin 11.0 (*) 12.0 - 15.0 g/dL    HCT 16.1 (*) 09.6 - 46.0 %    MCV 88.3  78.0 - 100.0 fL    MCH 30.6  26.0 - 34.0 pg    MCHC 34.6  30.0 - 36.0 g/dL    RDW 04.5  40.9 - 81.1 %    Platelets 80 (*) 150 - 400 K/uL   PROTIME-INR     Status: Normal   Collection Time   12/23/11  8:40 AM      Component Value Range Comment   Prothrombin Time 14.5  11.6 - 15.2 seconds    INR 1.15  0.00 - 1.49     Review of Systems  Constitutional:  Positive for malaise/fatigue. Negative for fever, chills and weight loss.  Respiratory: Positive for shortness of breath. Negative for cough, hemoptysis, sputum production and wheezing.   Cardiovascular: Negative for chest pain, palpitations and claudication.  Gastrointestinal: Negative.   Skin: Negative.   Neurological: Positive for weakness. Negative for dizziness, tingling, focal weakness and seizures.  Endo/Heme/Allergies: Negative.   Psychiatric/Behavioral: Positive for depression.    Blood pressure 136/66, pulse 66, temperature 98.2 F (36.8 C), temperature source Oral, resp. rate 18, SpO2 100.00%. Physical Exam  Constitutional: She is  oriented to person, place, and time. She appears well-developed and well-nourished. No distress.  HENT:  Head: Normocephalic and atraumatic.  Eyes: EOM are normal.  Cardiovascular: Normal rate.  Exam reveals no gallop and no friction rub.   Murmur heard. Respiratory: Effort normal and breath sounds normal. No respiratory distress. She has no wheezes. She has no rales. She exhibits no tenderness.  GI: Soft. Bowel sounds are normal. She exhibits no distension.  Neurological: She is alert and oriented to person, place, and time.  Skin: Skin is warm and dry.  Psychiatric: She has a normal mood and affect. Her behavior is normal. Judgment and thought content normal.     Assessment/Plan Procedure details for Bone marrow needle core biopsy discussed in detail with patient with her apparent understanding. Potential complications including but not limited to infection, bleeding, hematoma, inadequate sampling and complications with moderate sedation reviewed. Written consent obtained.   CAMPBELL,PAMELA D 12/23/2011, 9:43 AM

## 2012-01-08 ENCOUNTER — Other Ambulatory Visit (HOSPITAL_BASED_OUTPATIENT_CLINIC_OR_DEPARTMENT_OTHER): Payer: Managed Care, Other (non HMO) | Admitting: Lab

## 2012-01-08 DIAGNOSIS — D539 Nutritional anemia, unspecified: Secondary | ICD-10-CM

## 2012-01-08 LAB — CBC WITH DIFFERENTIAL/PLATELET
Basophils Absolute: 0 10*3/uL (ref 0.0–0.1)
EOS%: 4 % (ref 0.0–7.0)
HCT: 36.3 % (ref 34.8–46.6)
HGB: 12.2 g/dL (ref 11.6–15.9)
MCH: 31.7 pg (ref 25.1–34.0)
MCV: 94.2 fL (ref 79.5–101.0)
NEUT%: 62.2 % (ref 38.4–76.8)
lymph#: 1.1 10*3/uL (ref 0.9–3.3)

## 2012-01-14 ENCOUNTER — Ambulatory Visit (HOSPITAL_BASED_OUTPATIENT_CLINIC_OR_DEPARTMENT_OTHER): Payer: Managed Care, Other (non HMO) | Admitting: Family

## 2012-01-14 ENCOUNTER — Encounter: Payer: Self-pay | Admitting: Family

## 2012-01-14 ENCOUNTER — Telehealth: Payer: Self-pay | Admitting: Hematology and Oncology

## 2012-01-14 ENCOUNTER — Ambulatory Visit: Payer: Managed Care, Other (non HMO)

## 2012-01-14 ENCOUNTER — Telehealth: Payer: Self-pay | Admitting: *Deleted

## 2012-01-14 ENCOUNTER — Ambulatory Visit (HOSPITAL_BASED_OUTPATIENT_CLINIC_OR_DEPARTMENT_OTHER): Payer: Managed Care, Other (non HMO) | Admitting: Lab

## 2012-01-14 VITALS — BP 142/70 | HR 70 | Temp 97.6°F | Resp 20 | Ht 65.5 in | Wt 187.6 lb

## 2012-01-14 DIAGNOSIS — K7689 Other specified diseases of liver: Secondary | ICD-10-CM

## 2012-01-14 DIAGNOSIS — D509 Iron deficiency anemia, unspecified: Secondary | ICD-10-CM

## 2012-01-14 DIAGNOSIS — D539 Nutritional anemia, unspecified: Secondary | ICD-10-CM

## 2012-01-14 DIAGNOSIS — D696 Thrombocytopenia, unspecified: Secondary | ICD-10-CM

## 2012-01-14 NOTE — Patient Instructions (Addendum)
Please follow up with Dr. Danise Edge as requested by Dr. Dalene Carrow

## 2012-01-14 NOTE — Telephone Encounter (Signed)
Per staff message and POF I have scheduled appts.  JMW  

## 2012-01-14 NOTE — Progress Notes (Signed)
Patient ID: Jillian Sanders, female   DOB: Nov 01, 1950, 61 y.o.   MRN: [id number] CSN: [id number]  CC: Danise Edge, M.D.  [name], M.D.  [name], MD, Fax [phone]  Identifying Statement: Jillian Sanders is a 61 y.o. Caucasian female with a history of iron-deficiency anemia who presents for follow up.   Interval History: [name] is a 61 year-old Caucasian female with a history of iron-deficiency anemia who presents for follow-up.   The patient also has a history significant for NASH (followed by Dr. Danise Edge) and portal hypertension (followed by Dr. [name]).  [name] states that she has continuing fatigue and abdominal pain, both of which are ongoing.  The patient reports PICA and has been eating ice and enjoys the smell of gasoline. The patient underwent a bone marrow biopsy with Dr. Dalene Carrow on 12/23/2011.  Dr. Dalene Carrow saw the patient today and explained the bone marrow biopsy results to her.  The bone marrow report shows an abundance of RBCs, WBCs and platelets of normal morphology.   No myelodysplastic disease was seen.  The bone marrow report also shows adequate iron storage.   A copy of the bone marrow was given to the patient.  The patient denies any other symptomatology today including unusual bleeding, SOB, N/V/D or constipation, fever, chills or night sweats.  [name] states that she has been holding off on receiving an upper endoscopy study until she received the bone marrow report.  Dr. Dalene Carrow encouraged her to follow up with Dr. Laural Benes to get the study scheduled.   Medications: Current Outpatient Prescriptions  Medication Sig Dispense Refill  . alendronate (FOSAMAX) 70 MG tablet Take 70 mg by mouth every 7 (seven) days. Take with a full glass of water on an empty stomach.      . insulin aspart (NOVOLOG) 100 UNIT/ML injection Inject 5-30 Units into the skin 3 (three) times daily before meals. 150-200=5units 200-250=10units  250-300=15units 300-350=20units 350-400=25units 400-450=30units      . insulin glargine (LANTUS) 100 UNIT/ML injection Inject 50 Units into the skin 2 (two) times daily.      . Lactulose SOLN Take 30 mLs by mouth daily.       . nadolol (CORGARD) 40 MG tablet Take 40 mg by mouth every evening.      . pantoprazole (PROTONIX) 40 MG tablet Take 40 mg by mouth daily.      . promethazine (PHENERGAN) 25 MG tablet Take 25 mg by mouth every 6 (six) hours as needed. Nausea      . rifaximin (XIFAXAN) 550 MG TABS Take 550 mg by mouth 2 (two) times daily.      . rosuvastatin (CRESTOR) 10 MG tablet Take 10 mg by mouth every evening.       . valsartan (DIOVAN) 320 MG tablet Take 320 mg by mouth daily with breakfast.       . venlafaxine XR (EFFEXOR-XR) 75 MG 24 hr capsule Take 75 mg by mouth daily with breakfast.         Allergies: Allergies  Allergen Reactions  . Valium (Diazepam)     causes hyperactivity    Past Medical History: Past Medical History  Diagnosis Date  . Diabetes mellitus   . Depression   . Heart murmur   . Headache   . Arthritis   . Anemia     Fe deficiency  . Cirrhosis of liver not due to alcohol 2010    NASH  . Esophageal varices   .  Hypertension   . Hypercholesteremia   . Diverticular disease     s/p sigmoid colectomy  . Portal hypertension with esophageal varices      Family History: Family History  Problem Relation Age of Onset  . Diabetes Mother   . Hyperlipidemia Mother   . Cirrhosis Mother   . Cancer Father   . Heart disease Father     Social History: History  Substance Use Topics  . Smoking status: Former Smoker -- 1.0 packs/day for 10 years    Types: Cigarettes    Quit date: 12/09/1977  . Smokeless tobacco: Never Used  . Alcohol Use: No    Review of Systems: 10 point review of systems was completed and is negative except as noted above.   Physical Exam: Blood pressure 142/70, pulse 70, temperature 97.6 F (36.4 C), temperature source Oral,  resp. rate 20, height 5' 5.5" (1.664 m), weight 187 lb 9.6 oz (85.095 kg).  General appearance: Alert, cooperative, well nourished, no apparent distress Head: Normocephalic, without obvious abnormality, atraumatic Eyes: Conjunctivae/corneas clear, PERRLA, EOMI Nose: Nares,septum and mucosa are normal,  no drainage or sinus tenderness Neck: No adenopathy, supple, symmetrical, trachea midline, thyroid not enlarged, no tenderness Resp: Clear to auscultation bilaterally Cardio: Regular rate and rhythm, S1, S2 normal, 2/6 systolic murmur, no click, rub or gallop GI: Soft, distended non-tender, hypoactive bowel sounds, splenomegaly Extremities: Extremities normal, atraumatic, no cyanosis or edema Lymph nodes: Cervical, supraclavicular, and axillary nodes normal Neurologic: Grossly normal   Laboratory Data: Recent Results (from the past 168 hour(s))  CBC WITH DIFFERENTIAL   Collection Time   01/08/12 11:09 AM      Component Value Range   WBC 4.3  3.9 - 10.3 10e3/uL   NEUT# 2.7  1.5 - 6.5 10e3/uL   HGB 12.2  11.6 - 15.9 g/dL   HCT 30.8  65.7 - 84.6 %   Platelets 92 (*) 145 - 400 10e3/uL   MCV 94.2  79.5 - 101.0 fL   MCH 31.7  25.1 - 34.0 pg   MCHC 33.7  31.5 - 36.0 g/dL   RBC 9.62  9.52 - 8.41 10e6/uL   RDW 16.5 (*) 11.2 - 14.5 %   lymph# 1.1  0.9 - 3.3 10e3/uL   MONO# 0.3  0.1 - 0.9 10e3/uL   Eosinophils Absolute 0.2  0.0 - 0.5 10e3/uL   Basophils Absolute 0.0  0.0 - 0.1 10e3/uL   NEUT% 62.2  38.4 - 76.8 %   LYMPH% 26.1  14.0 - 49.7 %   MONO% 6.8  0.0 - 14.0 %   EOS% 4.0  0.0 - 7.0 %   BASO% 0.9  0.0 - 2.0 %   Pathology Report: 12/23/2011 Bone Marrow Report shows hypercellular bone marrow for age with trilineage hematopoiesis and relative abundance of erythroid precursors.   Impression/Plan: [name] is a 61 year old woman with a  history of: 1.  Iron deficiency anemia. Etiology undetermined; although she does have history of NASH (nonalcoholic steatohepatitis) with portal  hypertension.  Iron studies are pending at this time to determine if the patient is in need of a Feraheme infusion.  If her iron level is less than 100,  we will proceed with an infusion of Feraheme, 1020 mg on 01/21/2012.    2.  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  She is followed at [facility].    We will see the patient again in 5 months' time with laboratories (CMP, CBC, Iron/TIBC, Folate and B12)  to be drawn in advance of the office visit.  [name] will contact us in the interim if she has any questions or concerns.   Larina Bras, NP-C 01/14/2012, 9:16 AM

## 2012-01-14 NOTE — Telephone Encounter (Signed)
Printed and gv pt appt schedule for OCT and March...the patient aware of infusion

## 2012-01-15 LAB — IRON AND TIBC
%SAT: 38 % (ref 20–55)
TIBC: 263 ug/dL (ref 250–470)
UIBC: 163 ug/dL (ref 125–400)

## 2012-01-15 LAB — FOLATE: Folate: 14 ng/mL

## 2012-01-15 LAB — FERRITIN: Ferritin: 103 ng/mL (ref 10–291)

## 2012-01-20 ENCOUNTER — Telehealth: Payer: Self-pay | Admitting: *Deleted

## 2012-01-20 ENCOUNTER — Other Ambulatory Visit: Payer: Self-pay | Admitting: *Deleted

## 2012-01-20 NOTE — Telephone Encounter (Signed)
Called pt back and instructed pt that nurse will confirm with NP on 01/21/12 on whether pt still needs to come for Feraheme infusion.   Informed pt that nurse will call pt in the am with further instructions from NP.   Pt voiced understanding.

## 2012-01-20 NOTE — Telephone Encounter (Signed)
Received call from pt re:  Pt looked at her lab results from MyChart and noted her Ferritin level 103.   Pt wanted to know if she should come for Feraheme infusion on 01/21/12.   Informed pt to keep appt as scheduled,  But pt would not need lab rechecked again.   Pt voiced understanding,;

## 2012-01-21 ENCOUNTER — Ambulatory Visit: Payer: Managed Care, Other (non HMO)

## 2012-01-21 ENCOUNTER — Other Ambulatory Visit: Payer: Managed Care, Other (non HMO)

## 2012-01-21 ENCOUNTER — Telehealth: Payer: Self-pay | Admitting: *Deleted

## 2012-01-21 NOTE — Telephone Encounter (Signed)
Spoke with pt at home today .  Informed pt that since her ferritin is above 100,  Pt does not need to come in for [facility] today as per Annice Pih, NP.    Pt voiced understanding.

## 2012-02-13 ENCOUNTER — Telehealth: Payer: Self-pay | Admitting: Nurse Practitioner

## 2012-02-13 NOTE — Telephone Encounter (Signed)
Pt called requesting appointment for labs to check ferritin level.  States she is symptomatic including- cold, dizzy and eating excessive amounts of ice.  Information to Dr. Dalene Carrow for review and orders.

## 2012-02-17 ENCOUNTER — Other Ambulatory Visit: Payer: Self-pay | Admitting: *Deleted

## 2012-02-17 ENCOUNTER — Other Ambulatory Visit: Payer: Self-pay | Admitting: Hematology and Oncology

## 2012-02-17 DIAGNOSIS — D539 Nutritional anemia, unspecified: Secondary | ICD-10-CM

## 2012-02-18 ENCOUNTER — Other Ambulatory Visit (HOSPITAL_BASED_OUTPATIENT_CLINIC_OR_DEPARTMENT_OTHER): Payer: Managed Care, Other (non HMO)

## 2012-02-18 DIAGNOSIS — D539 Nutritional anemia, unspecified: Secondary | ICD-10-CM

## 2012-02-18 LAB — CBC WITH DIFFERENTIAL/PLATELET
Eosinophils Absolute: 0.1 10*3/uL (ref 0.0–0.5)
MONO#: 0.3 10*3/uL (ref 0.1–0.9)
MONO%: 7.7 % (ref 0.0–14.0)
NEUT#: 1.8 10*3/uL (ref 1.5–6.5)
RBC: 3.5 10*6/uL — ABNORMAL LOW (ref 3.70–5.45)
RDW: 14.7 % — ABNORMAL HIGH (ref 11.2–14.5)
WBC: 3.3 10*3/uL — ABNORMAL LOW (ref 3.9–10.3)

## 2012-02-18 LAB — FERRITIN: Ferritin: 42 ng/mL (ref 10–291)

## 2012-02-19 ENCOUNTER — Other Ambulatory Visit: Payer: Self-pay | Admitting: *Deleted

## 2012-02-19 ENCOUNTER — Other Ambulatory Visit: Payer: Self-pay | Admitting: Hematology and Oncology

## 2012-02-19 ENCOUNTER — Telehealth: Payer: Self-pay | Admitting: *Deleted

## 2012-02-19 NOTE — Telephone Encounter (Signed)
Spoke with pt at home and informed pt re:  Ferritin decreased to 42.   Informed pt that Dr. Dalene Carrow would like for pt to receive Feraheme.   Informed pt also that md thinks that pt can also benefit from Aranesp injection.  However, since pt lives out of town,  Pt wishes to have md explained to pt about Aranesp, and pt can sign consent when pt comes in for [facility] .

## 2012-02-20 ENCOUNTER — Telehealth: Payer: Self-pay | Admitting: Hematology and Oncology

## 2012-02-20 ENCOUNTER — Telehealth: Payer: Self-pay | Admitting: *Deleted

## 2012-02-20 ENCOUNTER — Other Ambulatory Visit: Payer: Self-pay | Admitting: *Deleted

## 2012-02-20 NOTE — Telephone Encounter (Signed)
Inf for tomorrow already scheduled and pt contacted by chemo scheduler.

## 2012-02-20 NOTE — Telephone Encounter (Signed)
Per staff phone call I have scheduled appt for tomorrow. I have also called the patient.  JWM

## 2012-02-20 NOTE — Telephone Encounter (Signed)
Spoke with pt and informed pt re:  Dr. Dalene Carrow would like to see pt in Jan, 2014  Instead of  March .   Informed pt that a scheduler will contact pt with new appts .   Explained to pt that md will discuss further about pt needin Aranesp injection.   Pt voiced understanding.  Pt will pick up new appt calendar when she comes in for [facility] on 02/21/12.

## 2012-02-21 ENCOUNTER — Telehealth: Payer: Self-pay | Admitting: Hematology and Oncology

## 2012-02-21 ENCOUNTER — Ambulatory Visit (HOSPITAL_BASED_OUTPATIENT_CLINIC_OR_DEPARTMENT_OTHER): Payer: Managed Care, Other (non HMO)

## 2012-02-21 VITALS — BP 120/71 | HR 72 | Temp 98.4°F | Resp 18

## 2012-02-21 DIAGNOSIS — D509 Iron deficiency anemia, unspecified: Secondary | ICD-10-CM

## 2012-02-21 DIAGNOSIS — D539 Nutritional anemia, unspecified: Secondary | ICD-10-CM

## 2012-02-21 MED ORDER — SODIUM CHLORIDE 0.9 % IV SOLN
Freq: Once | INTRAVENOUS | Status: AC
  Administered 2012-02-21: 14:00:00 via INTRAVENOUS

## 2012-02-21 MED ORDER — HEPARIN SOD (PORK) LOCK FLUSH 100 UNIT/ML IV SOLN
250.0000 [IU] | Freq: Once | INTRAVENOUS | Status: DC | PRN
  Filled 2012-02-21: qty 5

## 2012-02-21 MED ORDER — SODIUM CHLORIDE 0.9 % IV SOLN
1020.0000 mg | Freq: Once | INTRAVENOUS | Status: AC
  Administered 2012-02-21: 1020 mg via INTRAVENOUS
  Filled 2012-02-21: qty 34

## 2012-02-21 NOTE — Telephone Encounter (Signed)
gv and print pt appt schedule for jan 2014

## 2012-03-03 ENCOUNTER — Ambulatory Visit (INDEPENDENT_AMBULATORY_CARE_PROVIDER_SITE_OTHER): Payer: Managed Care, Other (non HMO) | Admitting: Ophthalmology

## 2012-03-03 DIAGNOSIS — E1165 Type 2 diabetes mellitus with hyperglycemia: Secondary | ICD-10-CM

## 2012-03-03 DIAGNOSIS — E11319 Type 2 diabetes mellitus with unspecified diabetic retinopathy without macular edema: Secondary | ICD-10-CM

## 2012-03-03 DIAGNOSIS — E1139 Type 2 diabetes mellitus with other diabetic ophthalmic complication: Secondary | ICD-10-CM

## 2012-03-03 DIAGNOSIS — H2510 Age-related nuclear cataract, unspecified eye: Secondary | ICD-10-CM

## 2012-03-03 DIAGNOSIS — H3530 Unspecified macular degeneration: Secondary | ICD-10-CM

## 2012-03-03 DIAGNOSIS — H43819 Vitreous degeneration, unspecified eye: Secondary | ICD-10-CM

## 2012-03-21 ENCOUNTER — Telehealth: Payer: Self-pay | Admitting: *Deleted

## 2012-03-21 NOTE — Telephone Encounter (Signed)
Pt called concerning 04/25/2011 appt, pt was notified that Dr. Dalene Carrow would not be with practice after 03/31/2012 and her care would be handled by Dr. Cyndie Chime. Pt was also notified that appt needed to be rescheduled with a different provider. New appt is 04/24/2012 with  Lonna Cobb @ 9:15. Pt agreed with appt change and new provider

## 2012-03-24 ENCOUNTER — Telehealth: Payer: Self-pay | Admitting: Oncology

## 2012-03-24 NOTE — Telephone Encounter (Signed)
Gave pt appt for new appt with Lonna Cobb re-establishing care with Dr. Cyndie Chime , appt is 1 hour

## 2012-04-04 ENCOUNTER — Encounter: Payer: Self-pay | Admitting: *Deleted

## 2012-04-17 ENCOUNTER — Other Ambulatory Visit: Payer: Managed Care, Other (non HMO) | Admitting: Lab

## 2012-04-24 ENCOUNTER — Ambulatory Visit: Payer: Managed Care, Other (non HMO) | Admitting: Hematology and Oncology

## 2012-04-24 ENCOUNTER — Ambulatory Visit: Payer: Managed Care, Other (non HMO) | Admitting: Nurse Practitioner

## 2012-04-27 ENCOUNTER — Telehealth: Payer: Self-pay | Admitting: Oncology

## 2012-04-27 NOTE — Telephone Encounter (Signed)
Called pt and left message r/s from 04/28/12 to 05/19/12 re-establishing with Dr. Reece Agar, former patient of Dr. Dalene Carrow, was scheduled with Misty Stanley on 04/28/12 lab and MD

## 2012-04-28 ENCOUNTER — Ambulatory Visit: Payer: Managed Care, Other (non HMO) | Admitting: Nurse Practitioner

## 2012-04-28 ENCOUNTER — Other Ambulatory Visit: Payer: Managed Care, Other (non HMO)

## 2012-05-16 ENCOUNTER — Other Ambulatory Visit: Payer: Self-pay

## 2012-05-19 ENCOUNTER — Ambulatory Visit (HOSPITAL_BASED_OUTPATIENT_CLINIC_OR_DEPARTMENT_OTHER): Payer: Managed Care, Other (non HMO) | Admitting: Oncology

## 2012-05-19 ENCOUNTER — Other Ambulatory Visit (HOSPITAL_BASED_OUTPATIENT_CLINIC_OR_DEPARTMENT_OTHER): Payer: Managed Care, Other (non HMO) | Admitting: Lab

## 2012-05-19 ENCOUNTER — Encounter: Payer: Self-pay | Admitting: Oncology

## 2012-05-19 VITALS — BP 126/63 | HR 93 | Temp 100.8°F | Resp 20 | Ht 65.5 in | Wt 185.8 lb

## 2012-05-19 DIAGNOSIS — D539 Nutritional anemia, unspecified: Secondary | ICD-10-CM

## 2012-05-19 DIAGNOSIS — I8510 Secondary esophageal varices without bleeding: Secondary | ICD-10-CM | POA: Insufficient documentation

## 2012-05-19 DIAGNOSIS — D61818 Other pancytopenia: Secondary | ICD-10-CM

## 2012-05-19 DIAGNOSIS — I8500 Esophageal varices without bleeding: Secondary | ICD-10-CM

## 2012-05-19 DIAGNOSIS — J209 Acute bronchitis, unspecified: Secondary | ICD-10-CM

## 2012-05-19 DIAGNOSIS — R161 Splenomegaly, not elsewhere classified: Secondary | ICD-10-CM

## 2012-05-19 DIAGNOSIS — IMO0001 Reserved for inherently not codable concepts without codable children: Secondary | ICD-10-CM

## 2012-05-19 DIAGNOSIS — K766 Portal hypertension: Secondary | ICD-10-CM

## 2012-05-19 DIAGNOSIS — K7460 Unspecified cirrhosis of liver: Secondary | ICD-10-CM

## 2012-05-19 DIAGNOSIS — K7581 Nonalcoholic steatohepatitis (NASH): Secondary | ICD-10-CM | POA: Insufficient documentation

## 2012-05-19 LAB — CBC WITH DIFFERENTIAL/PLATELET
BASO%: 0.5 % (ref 0.0–2.0)
HCT: 26.7 % — ABNORMAL LOW (ref 34.8–46.6)
HGB: 9.2 g/dL — ABNORMAL LOW (ref 11.6–15.9)
MCHC: 34.4 g/dL (ref 31.5–36.0)
MONO#: 0.3 10*3/uL (ref 0.1–0.9)
NEUT%: 82.5 % — ABNORMAL HIGH (ref 38.4–76.8)
WBC: 4.8 10*3/uL (ref 3.9–10.3)
lymph#: 0.6 10*3/uL — ABNORMAL LOW (ref 0.9–3.3)

## 2012-05-19 LAB — FERRITIN: Ferritin: 26 ng/mL (ref 10–291)

## 2012-05-19 LAB — COMPREHENSIVE METABOLIC PANEL (CC13)
ALT: 28 U/L (ref 0–55)
Albumin: 2.6 g/dL — ABNORMAL LOW (ref 3.5–5.0)
CO2: 20 mEq/L — ABNORMAL LOW (ref 22–29)
Calcium: 8.9 mg/dL (ref 8.4–10.4)
Chloride: 108 mEq/L — ABNORMAL HIGH (ref 98–107)
Creatinine: 0.9 mg/dL (ref 0.6–1.1)
Potassium: 3.8 mEq/L (ref 3.5–5.1)
Total Protein: 6.4 g/dL (ref 6.4–8.3)

## 2012-05-19 LAB — FOLATE: Folate: 10.9 ng/mL

## 2012-05-19 LAB — IRON AND TIBC: %SAT: 9 % — ABNORMAL LOW (ref 20–55)

## 2012-05-19 LAB — VITAMIN B12: Vitamin B-12: 582 pg/mL (ref 211–911)

## 2012-05-19 NOTE — Progress Notes (Signed)
Hematology and Oncology Follow Up Visit  Jillian Sanders [id number] 10-05-1950 61 y.o. 05/19/2012 7:30 PM   Principle Diagnosis: Encounter Diagnoses  Name Primary?  . Other pancytopenia Yes  . Unspecified deficiency anemia   . Cirrhosis, non-alcoholic      Interim History:  Followup visit for this 62 year old woman formerly followed by Dr. Dalene Carrow. She was referred here back in September 2013 to evaluate iron deficiency anemia. 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 Both her mother who is still alive at age 28,  and one of her 2 sisters age 74 also have this disorder. She was first diagnosed in 2010. At around that same time she had developed profuse epistaxis. She was evaluated by a hematologist in [place] where she lives. She was given  parenteral iron. Upper endoscopy showed esophageal varices. Colonoscopy with benign polyps. Also of note is the fact that she is a long-standing insulin-dependent diabetic.  Dr. Dalene Carrow did a number of ancillary tests at the time of her initial evaluation. On 12/17/2011 hemoglobin 11.5, hematocrit 33.5, MCV 88, white count 2800, 58% neutrophils, 31% lymphocytes, 7% monocytes, platelet count 73,000. serum iron 61, TIBC 359, percent saturation 17, ferritin 28, serum protein electrophoresis showed a polyclonal elevation in the gammaglobulin fraction consistent with her chronic hepatitis, B12 normal,  folic acid normal, pro time 14.5 seconds, PTT 35 seconds, bilirubin 1.5, albumin 3.3, BUN 8, creatinine 0.8, haptoglobin decreased slightly at 31 (45-2:15) compatible with decreased hepatic synthetic function. She has chronic transaminase and alkaline phosphatase elevation.  She was given a parenteral iron infusion on 12/20/2011. Lab done on October 15 showed a rise in her ferritin up to 103, hemoglobin rose to a peak of 12.2 from a baseline value of  11.5 with MCV 87.9 recorded on 12/17/2011. Hemoglobin has drifted down to today's value of 9.2 with MCV 86.5. She has an acute bronchitis and her white count today is 4800 with 80 3% neutrophils.  Patient feels she is still iron deficient or she has symptoms of hair loss, fatigue, and pagophagia.  Medications: reviewed  Allergies:  Allergies  Allergen Reactions  . Valium (Diazepam)     causes hyperactivity    Review of Systems: Constitutional:   See above Respiratory: She contracted an acute bronchitis this week. Cardiovascular:  No chest pain or palpitations Gastrointestinal: No abdominal pain, no hematochezia or melena Genito-Urinary: No hematuria Musculoskeletal: No inflammatory arthritis symptoms Neurologic: No headache or change in vision Skin: No rash or ecchymosis Remaining ROS negative.  Physical Exam: Blood pressure 126/63, pulse 93, temperature 100.8 F (38.2 C), temperature source Oral, resp. rate 20, height 5' 5.5" (1.664 m), weight 185 lb 12.8 oz (84.278 kg). Wt Readings from Last 3 Encounters:  05/19/12 185 lb 12.8 oz (84.278 kg)  01/14/12 187 lb 9.6 oz (85.095 kg)  12/17/11 184 lb 6.4 oz (83.643 kg)     General appearance: Well-nourished Caucasian woman HENNT: Pharynx no erythema or exudate Lymph nodes: No lymphadenopathy Breasts: Lungs: Clear to auscultation resonant to percussion Heart: Regular rhythm. 2/6 systolic murmur at the left sternal border and transmitted to the right second intercostal space Abdomen: Soft, nontender, liver enlarged 8 cm below right costal margin, spleen and enlarged 8-10 cm below left costal margin Extremities: No edema, no calf tenderness Vascular: No cyanosis, no carotid bruits Neurologic: Alert and oriented, PERRLA, optic disc sharp and vessels normal, motor strength 5 over 5, reflexes 1+ symmetric, sensation intact  to vibration by tuning fork exam over the fingertips Skin: Spider hemangiomas across the [facility] area of her  chest  Lab Results: Lab Results  Component Value Date   WBC 4.8 05/19/2012   HGB 9.2* 05/19/2012   HCT 26.7* 05/19/2012   MCV 86.5 05/19/2012   PLT 88* 05/19/2012  White count differential 82% neutrophils 12% lymphocytes 5% monocytes   Chemistry      Component Value Date/Time   NA 135* 05/19/2012 1326   K 3.8 05/19/2012 1326   CL 108* 05/19/2012 1326   CO2 20* 05/19/2012 1326   BUN 11.6 05/19/2012 1326   CREATININE 0.9 05/19/2012 1326      Component Value Date/Time   CALCIUM 8.9 05/19/2012 1326   ALKPHOS 159* 05/19/2012 1326   AST 44* 05/19/2012 1326   ALT 28 05/19/2012 1326   BILITOT 1.46* 05/19/2012 1326       Radiological Studies: No results found.  Impression and Plan: #1. Pancytopenia  Although she may have been iron depleted in the past, there is no evidence that she has an iron deficiency anemia. She is pancytopenic related to cirrhosis and splenomegaly. I do not feel that she should get any additional parenteral iron supplements. I gave her a prescription for Niferex oral iron. Iron studies done today are pending. I will check iron studies again in 3 months. If I don't find any evidence for iron malabsorption, I will discontinue the iron supplements.  #2. Acute bronchitis. I gave her prescription for a Z-Pak.   CC:.    Levert Feinstein, MD 2/18/20147:30 PM

## 2012-05-22 ENCOUNTER — Telehealth: Payer: Self-pay | Admitting: *Deleted

## 2012-05-22 NOTE — Telephone Encounter (Signed)
Called and spoke with pt.  Let her know iron levels were low and she is to take the new iron prep he prescribed for her.

## 2012-05-22 NOTE — Telephone Encounter (Signed)
Message copied by Orbie Hurst on Fri May 22, 2012  5:15 PM ------      Message from: Levert Feinstein      Created: Fri May 22, 2012  8:44 AM       Call pt  Iron levels low  Take new iron prep I prescribed for her ------

## 2012-06-16 ENCOUNTER — Other Ambulatory Visit: Payer: Managed Care, Other (non HMO) | Admitting: Lab

## 2012-06-17 ENCOUNTER — Telehealth: Payer: Self-pay | Admitting: *Deleted

## 2012-06-17 NOTE — Telephone Encounter (Signed)
Pt called about appt she saw on MyChart re:  IV infusion for tomorrow.  Verified with Dr Cyndie Chime that this is not needed for now & cancelled appt.

## 2012-06-19 ENCOUNTER — Ambulatory Visit: Payer: Managed Care, Other (non HMO)

## 2012-06-19 ENCOUNTER — Ambulatory Visit: Payer: Managed Care, Other (non HMO) | Admitting: Hematology and Oncology

## 2012-07-13 ENCOUNTER — Telehealth: Payer: Self-pay | Admitting: *Deleted

## 2012-07-13 NOTE — Telephone Encounter (Signed)
Returned pt.'s call.  She feels like her iron pills aren't working.  She saw her MD @[facility]  (liver transplant specialist).  Her Hgb was down to 8.3 and it was suggested she go back to Dr. Cyndie Chime.  Patient c/o being tired, dizzy, cold and SOB.   Let her know will discuss with Dr. Cyndie Chime and get back with her .  Call back number is [phone].

## 2012-07-14 ENCOUNTER — Telehealth: Payer: Self-pay | Admitting: Oncology

## 2012-07-14 ENCOUNTER — Telehealth: Payer: Self-pay | Admitting: *Deleted

## 2012-07-14 ENCOUNTER — Other Ambulatory Visit: Payer: Self-pay | Admitting: Oncology

## 2012-07-14 DIAGNOSIS — D539 Nutritional anemia, unspecified: Secondary | ICD-10-CM

## 2012-07-14 NOTE — Telephone Encounter (Signed)
Called patient and let her know that Dr. Cyndie Chime said we can schedule IV iron this week.  She prefers Wed or Thursday.  Called her back and left message that we have her scheduled for Wed. 4/16 at 1:30pm.  Please be here by 1:15pm to register.

## 2012-07-15 ENCOUNTER — Encounter (HOSPITAL_COMMUNITY)
Admission: RE | Admit: 2012-07-15 | Discharge: 2012-07-15 | Disposition: A | Discharge status: Home / Self Care | Payer: Managed Care, Other (non HMO) | Source: Ambulatory Visit | Attending: Oncology | Admitting: Oncology

## 2012-07-15 ENCOUNTER — Telehealth: Payer: Self-pay | Admitting: Oncology

## 2012-07-15 ENCOUNTER — Ambulatory Visit (HOSPITAL_BASED_OUTPATIENT_CLINIC_OR_DEPARTMENT_OTHER): Payer: Managed Care, Other (non HMO)

## 2012-07-15 ENCOUNTER — Other Ambulatory Visit (HOSPITAL_BASED_OUTPATIENT_CLINIC_OR_DEPARTMENT_OTHER): Payer: Managed Care, Other (non HMO) | Admitting: Lab

## 2012-07-15 VITALS — BP 114/67 | HR 69 | Temp 97.9°F | Resp 20

## 2012-07-15 DIAGNOSIS — D539 Nutritional anemia, unspecified: Secondary | ICD-10-CM

## 2012-07-15 DIAGNOSIS — D649 Anemia, unspecified: Secondary | ICD-10-CM

## 2012-07-15 DIAGNOSIS — D509 Iron deficiency anemia, unspecified: Secondary | ICD-10-CM

## 2012-07-15 DIAGNOSIS — D61818 Other pancytopenia: Secondary | ICD-10-CM

## 2012-07-15 LAB — CBC & DIFF AND RETIC
Basophils Absolute: 0 10*3/uL (ref 0.0–0.1)
Eosinophils Absolute: 0.1 10*3/uL (ref 0.0–0.5)
HGB: 7.9 g/dL — ABNORMAL LOW (ref 11.6–15.9)
Immature Retic Fract: 12.7 % — ABNORMAL HIGH (ref 1.60–10.00)
MCV: 80.5 fL (ref 79.5–101.0)
MONO#: 0.2 10*3/uL (ref 0.1–0.9)
NEUT#: 1.6 10*3/uL (ref 1.5–6.5)
RBC: 3.18 10*6/uL — ABNORMAL LOW (ref 3.70–5.45)
RDW: 15.3 % — ABNORMAL HIGH (ref 11.2–14.5)
Retic %: 2.76 % — ABNORMAL HIGH (ref 0.70–2.10)
Retic Ct Abs: 87.77 10*3/uL (ref 33.70–90.70)
WBC: 2.8 10*3/uL — ABNORMAL LOW (ref 3.9–10.3)
lymph#: 0.9 10*3/uL (ref 0.9–3.3)
nRBC: 0 % (ref 0–0)

## 2012-07-15 LAB — ABO/RH: ABO/RH(D): O POS

## 2012-07-15 LAB — IRON AND TIBC
Iron: 35 ug/dL — ABNORMAL LOW (ref 42–145)
TIBC: 381 ug/dL (ref 250–470)
UIBC: 346 ug/dL (ref 125–400)

## 2012-07-15 MED ORDER — SODIUM CHLORIDE 0.9 % IV SOLN
1020.0000 mg | Freq: Once | INTRAVENOUS | Status: AC
  Administered 2012-07-15: 1020 mg via INTRAVENOUS
  Filled 2012-07-15: qty 34

## 2012-07-15 NOTE — Telephone Encounter (Signed)
S/w the pt regarding her needing to be typed and cross per pt she has already did that type and cross match on 07/15/2012.

## 2012-07-15 NOTE — Patient Instructions (Addendum)
Ferumoxytol injection What is this medicine? FERUMOXYTOL is an iron complex. Iron is used to make healthy red blood cells, which carry oxygen and nutrients throughout the body. This medicine is used to treat iron deficiency anemia in people with chronic kidney disease. This medicine may be used for other purposes; ask your health care provider or pharmacist if you have questions. What should I tell my health care provider before I take this medicine? They need to know if you have any of these conditions: -anemia not caused by low iron levels -high levels of iron in the blood -magnetic resonance imaging (MRI) test scheduled -an unusual or allergic reaction to iron, other medicines, foods, dyes, or preservatives -pregnant or trying to get pregnant -breast-feeding How should I use this medicine? This medicine is for infusion into a vein. It is given by a health care professional in a hospital or clinic setting. Talk to your pediatrician regarding the use of this medicine in children. Special care may be needed. Overdosage: If you think you've taken too much of this medicine contact a poison control center or emergency room at once. Overdosage: If you think you have taken too much of this medicine contact a poison control center or emergency room at once. NOTE: This medicine is only for you. Do not share this medicine with others. What if I miss a dose? It is important not to miss your dose. Call your doctor or health care professional if you are unable to keep an appointment. What may interact with this medicine? This medicine may interact with the following medications: -other iron products This list may not describe all possible interactions. Give your health care provider a list of all the medicines, herbs, non-prescription drugs, or dietary supplements you use. Also tell them if you smoke, drink alcohol, or use illegal drugs. Some items may interact with your medicine. What should I watch  for while using this medicine? Visit your doctor or healthcare professional regularly. Tell your doctor or healthcare professional if your symptoms do not start to get better or if they get worse. You may need blood work done while you are taking this medicine. You may need to follow a special diet. Talk to your doctor. Foods that contain iron include: whole grains/cereals, dried fruits, beans, or peas, leafy green vegetables, and organ meats (liver, kidney). What side effects may I notice from receiving this medicine? Side effects that you should report to your doctor or health care professional as soon as possible: -allergic reactions like skin rash, itching or hives, swelling of the face, lips, or tongue -breathing problems -changes in blood pressure -feeling faint or lightheaded, falls -fever or chills -flushing, sweating, or hot feelings -swelling of the ankles or feet Side effects that usually do not require medical attention (Report these to your doctor or health care professional if they continue or are bothersome.): -diarrhea -headache -nausea, vomiting -stomach pain This list may not describe all possible side effects. Call your doctor for medical advice about side effects. You may report side effects to FDA at 1-800-FDA-1088. Where should I keep my medicine? This drug is given in a hospital or clinic and will not be stored at home. NOTE: This sheet is a summary. It may not cover all possible information. If you have questions about this medicine, talk to your doctor, pharmacist, or health care provider.  2012, Elsevier/[employer] Standard. (12/09/2007 9:48:25 PM)Ferumoxytol injection What is this medicine? FERUMOXYTOL is an iron complex. Iron is used to make healthy red  blood cells, which carry oxygen and nutrients throughout the body. This medicine is used to treat iron deficiency anemia in people with chronic kidney disease. This medicine may be used for other purposes; ask your health  care provider or pharmacist if you have questions. What should I tell my health care provider before I take this medicine? They need to know if you have any of these conditions: -anemia not caused by low iron levels -high levels of iron in the blood -magnetic resonance imaging (MRI) test scheduled -an unusual or allergic reaction to iron, other medicines, foods, dyes, or preservatives -pregnant or trying to get pregnant -breast-feeding How should I use this medicine? This medicine is for infusion into a vein. It is given by a health care professional in a hospital or clinic setting. Talk to your pediatrician regarding the use of this medicine in children. Special care may be needed. Overdosage: If you think you've taken too much of this medicine contact a poison control center or emergency room at once. Overdosage: If you think you have taken too much of this medicine contact a poison control center or emergency room at once. NOTE: This medicine is only for you. Do not share this medicine with others. What if I miss a dose? It is important not to miss your dose. Call your doctor or health care professional if you are unable to keep an appointment. What may interact with this medicine? This medicine may interact with the following medications: -other iron products This list may not describe all possible interactions. Give your health care provider a list of all the medicines, herbs, non-prescription drugs, or dietary supplements you use. Also tell them if you smoke, drink alcohol, or use illegal drugs. Some items may interact with your medicine. What should I watch for while using this medicine? Visit your doctor or healthcare professional regularly. Tell your doctor or healthcare professional if your symptoms do not start to get better or if they get worse. You may need blood work done while you are taking this medicine. You may need to follow a special diet. Talk to your doctor. Foods that  contain iron include: whole grains/cereals, dried fruits, beans, or peas, leafy green vegetables, and organ meats (liver, kidney). What side effects may I notice from receiving this medicine? Side effects that you should report to your doctor or health care professional as soon as possible: -allergic reactions like skin rash, itching or hives, swelling of the face, lips, or tongue -breathing problems -changes in blood pressure -feeling faint or lightheaded, falls -fever or chills -flushing, sweating, or hot feelings -swelling of the ankles or feet Side effects that usually do not require medical attention (Report these to your doctor or health care professional if they continue or are bothersome.): -diarrhea -headache -nausea, vomiting -stomach pain This list may not describe all possible side effects. Call your doctor for medical advice about side effects. You may report side effects to FDA at 1-800-FDA-1088. Where should I keep my medicine? This drug is given in a hospital or clinic and will not be stored at home. NOTE: This sheet is a summary. It may not cover all possible information. If you have questions about this medicine, talk to your doctor, pharmacist, or health care provider.  2012, Elsevier/Gold Standard. (12/09/2007 9:48:25 PM)Ferumoxytol injection What is this medicine? FERUMOXYTOL is an iron complex. Iron is used to make healthy red blood cells, which carry oxygen and nutrients throughout the body. This medicine is used to treat iron  deficiency anemia in people with chronic kidney disease. This medicine may be used for other purposes; ask your health care provider or pharmacist if you have questions. What should I tell my health care provider before I take this medicine? They need to know if you have any of these conditions: -anemia not caused by low iron levels -high levels of iron in the blood -magnetic resonance imaging (MRI) test scheduled -an unusual or allergic  reaction to iron, other medicines, foods, dyes, or preservatives -pregnant or trying to get pregnant -breast-feeding How should I use this medicine? This medicine is for infusion into a vein. It is given by a health care professional in a hospital or clinic setting. Talk to your pediatrician regarding the use of this medicine in children. Special care may be needed. Overdosage: If you think you've taken too much of this medicine contact a poison control center or emergency room at once. Overdosage: If you think you have taken too much of this medicine contact a poison control center or emergency room at once. NOTE: This medicine is only for you. Do not share this medicine with others. What if I miss a dose? It is important not to miss your dose. Call your doctor or health care professional if you are unable to keep an appointment. What may interact with this medicine? This medicine may interact with the following medications: -other iron products This list may not describe all possible interactions. Give your health care provider a list of all the medicines, herbs, non-prescription drugs, or dietary supplements you use. Also tell them if you smoke, drink alcohol, or use illegal drugs. Some items may interact with your medicine. What should I watch for while using this medicine? Visit your doctor or healthcare professional regularly. Tell your doctor or healthcare professional if your symptoms do not start to get better or if they get worse. You may need blood work done while you are taking this medicine. You may need to follow a special diet. Talk to your doctor. Foods that contain iron include: whole grains/cereals, dried fruits, beans, or peas, leafy green vegetables, and organ meats (liver, kidney). What side effects may I notice from receiving this medicine? Side effects that you should report to your doctor or health care professional as soon as possible: -allergic reactions like skin rash,  itching or hives, swelling of the face, lips, or tongue -breathing problems -changes in blood pressure -feeling faint or lightheaded, falls -fever or chills -flushing, sweating, or hot feelings -swelling of the ankles or feet Side effects that usually do not require medical attention (Report these to your doctor or health care professional if they continue or are bothersome.): -diarrhea -headache -nausea, vomiting -stomach pain This list may not describe all possible side effects. Call your doctor for medical advice about side effects. You may report side effects to FDA at 1-800-FDA-1088. Where should I keep my medicine? This drug is given in a hospital or clinic and will not be stored at home. NOTE: This sheet is a summary. It may not cover all possible information. If you have questions about this medicine, talk to your doctor, pharmacist, or health care provider.  2012, Elsevier/Gold Standard. (12/09/2007 9:48:25 PM)

## 2012-07-15 NOTE — Progress Notes (Signed)
Labs reviewed with Dr. Reece Agar., Hgb 7.9. Verbal order Type and Cross . Pt to receive 2 units prbc's first available apt.  ONCTX sched sent for pt apt. Pt advised she will need to units of prbc's, apt set for Friday 07/17/12. Pt verbalized understanding.

## 2012-07-17 ENCOUNTER — Other Ambulatory Visit: Payer: Managed Care, Other (non HMO) | Admitting: Lab

## 2012-07-17 ENCOUNTER — Ambulatory Visit (HOSPITAL_BASED_OUTPATIENT_CLINIC_OR_DEPARTMENT_OTHER): Payer: Managed Care, Other (non HMO)

## 2012-07-17 VITALS — BP 145/73 | HR 73 | Temp 98.5°F | Resp 20

## 2012-07-17 DIAGNOSIS — D649 Anemia, unspecified: Secondary | ICD-10-CM

## 2012-07-17 MED ORDER — DIPHENHYDRAMINE HCL 25 MG PO CAPS
25.0000 mg | ORAL_CAPSULE | Freq: Once | ORAL | Status: AC
  Administered 2012-07-17: 25 mg via ORAL

## 2012-07-17 MED ORDER — ACETAMINOPHEN 325 MG PO TABS
650.0000 mg | ORAL_TABLET | Freq: Once | ORAL | Status: AC
  Administered 2012-07-17: 650 mg via ORAL

## 2012-07-17 MED ORDER — SODIUM CHLORIDE 0.9 % IV SOLN
250.0000 mL | Freq: Once | INTRAVENOUS | Status: AC
  Administered 2012-07-17: 250 mL via INTRAVENOUS

## 2012-07-17 NOTE — Patient Instructions (Addendum)
Blood Products Information This is information about transfusions of blood products. All blood that is to be transfused is tested for blood type, compatibility with the recipient, and for infections. Except in emergencies, giving a transfusion requires a written consent. Blood transfusions are often given as packed red blood cells. This means the other parts of the blood have been taken out. Blood may be needed to treat severe anemia or bleeding. Other blood products include plasma, platelets, immune globulin, and cryoprecipitate. Blood for transfusion is mostly donated by volunteers. The blood donors are carefully screened for risk factors that could cause disease. Donors are all tested for infections that could be transmitted by blood. The blood product supply today is the safest it has ever been. Some risks do remain.  A minor reaction with fever, chills, or rash happens in about 1% of blood product transfusions.  Life-threatening reactions occur in less than 1 in a million transfusions.  Infection with germs (bacteria), viruses or parasites like malaria can still happen. The risk is very low.  Hepatitis B occurs in about 1 case in 150,000 transfusions.  Hepatitis C is seen once in 500,000.  HIV is transmitted less than once every million transfusions. When you receive a transfusion of packed red blood cells, your blood is tested for blood group and Rh type. Your blood is also screened for antibodies that could cause a serious reaction. A cross-match test is done to make sure the blood is safe to give.  Talk with your caregiver if you have any concerns about receiving a transfusion of blood products. Make sure your questions are answered. Transfusions are not given if your caregiver feels the risk is greater than the need. Document Released: 03/18/2005 Document Revised: 06/10/2011 Document Reviewed: 09/05/2006 [employer] Patient Information 2013 [employer].  

## 2012-07-18 LAB — TYPE AND SCREEN
ABO/RH(D): O POS
Antibody Screen: NEGATIVE
Unit division: 0

## 2012-07-22 ENCOUNTER — Other Ambulatory Visit: Payer: Self-pay | Admitting: *Deleted

## 2012-07-22 DIAGNOSIS — D509 Iron deficiency anemia, unspecified: Secondary | ICD-10-CM

## 2012-07-22 MED ORDER — POLYSACCHARIDE IRON COMPLEX 150 MG PO CAPS: 150.0000 mg | ORAL_CAPSULE | Freq: Every day | ORAL | Status: DC

## 2012-07-22 MED ORDER — AZITHROMYCIN 250 MG PO TABS: ORAL_TABLET | ORAL | Status: DC

## 2012-08-04 ENCOUNTER — Ambulatory Visit (HOSPITAL_BASED_OUTPATIENT_CLINIC_OR_DEPARTMENT_OTHER): Payer: Managed Care, Other (non HMO) | Admitting: Oncology

## 2012-08-04 ENCOUNTER — Other Ambulatory Visit (HOSPITAL_BASED_OUTPATIENT_CLINIC_OR_DEPARTMENT_OTHER): Payer: Managed Care, Other (non HMO) | Admitting: Lab

## 2012-08-04 VITALS — BP 113/69 | HR 91 | Temp 97.1°F | Resp 18 | Ht 65.0 in | Wt 179.4 lb

## 2012-08-04 DIAGNOSIS — D61818 Other pancytopenia: Secondary | ICD-10-CM

## 2012-08-04 DIAGNOSIS — D539 Nutritional anemia, unspecified: Secondary | ICD-10-CM

## 2012-08-04 DIAGNOSIS — K766 Portal hypertension: Secondary | ICD-10-CM

## 2012-08-04 DIAGNOSIS — D518 Other vitamin B12 deficiency anemias: Secondary | ICD-10-CM

## 2012-08-04 DIAGNOSIS — K7689 Other specified diseases of liver: Secondary | ICD-10-CM

## 2012-08-04 DIAGNOSIS — I8500 Esophageal varices without bleeding: Secondary | ICD-10-CM

## 2012-08-04 DIAGNOSIS — R509 Fever, unspecified: Secondary | ICD-10-CM

## 2012-08-04 DIAGNOSIS — K7460 Unspecified cirrhosis of liver: Secondary | ICD-10-CM

## 2012-08-04 LAB — CBC & DIFF AND RETIC
BASO%: 0.7 % (ref 0.0–2.0)
EOS%: 3.6 % (ref 0.0–7.0)
Eosinophils Absolute: 0.2 10*3/uL (ref 0.0–0.5)
HCT: 35.5 % (ref 34.8–46.6)
HGB: 11.4 g/dL — ABNORMAL LOW (ref 11.6–15.9)
LYMPH%: 26.5 % (ref 14.0–49.7)
MCH: 27.5 pg (ref 25.1–34.0)
MCHC: 32.1 g/dL (ref 31.5–36.0)
MONO#: 0.4 10*3/uL (ref 0.1–0.9)
NEUT#: 2.6 10*3/uL (ref 1.5–6.5)
NEUT%: 60.8 % (ref 38.4–76.8)
Platelets: 111 10*3/uL — ABNORMAL LOW (ref 145–400)

## 2012-08-04 LAB — FERRITIN: Ferritin: 123 ng/mL (ref 10–291)

## 2012-08-04 LAB — IRON AND TIBC
%SAT: 40 % (ref 20–55)
Iron: 119 ug/dL (ref 42–145)
UIBC: 176 ug/dL (ref 125–400)

## 2012-08-05 ENCOUNTER — Telehealth: Payer: Self-pay | Admitting: Oncology

## 2012-08-05 NOTE — Progress Notes (Signed)
Hematology and Oncology Follow Up Visit  Jillian Sanders [id number] 21-Jul-1950 62 y.o. 08/05/2012 8:10 PM   Principle Diagnosis: Encounter Diagnoses  Name Primary?  Marland Kitchen Unspecified deficiency anemia Yes  . Portal hypertension with esophageal varices   . Other pancytopenia   . Intermittent FUO      Interim History:   Short followup visit for this 62 year old woman whose care I recently assumed when Dr. Dalene Carrow left the practice. She has a rather complicated situation detailed in my summary note dated 05/19/2012. 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 She was receiving periodic parenteral iron infusions. When I saw her I was not convinced that she had a adequate trial of oral iron and I started her on Niferex 150 mg twice daily. Although she had no GI intolerance of this preparation, unfortunately it does appear that she does not absorb iron with a rather rapid fall in her serum ferritin levels. On 12/17/2011 hemoglobin 11.5, iron 61, ferritin 28. She received an iron infusion on September 20. On November 19 hemoglobin 11.2, ferritin 42, and she received another dose of IV iron on November 22. I started her on the oral iron at time of the 05/19/2012 visit. Hemoglobin was 9.2, ferritin was 26. Despite compliance with the iron, hemoglobin fell to 7.9 and ferritin down to 11 by April 16 and she required another dose of IV iron on April 16. Hemoglobin today is 11.4, ferritin 123.  At time of her visit with me in February, she complained of a nonproductive cough. I gave her a Z-Pak. The cough subsided. However she now reports intermittent episodes of shaking chills and fevers up to 102. She does have a history of diverticulitis but her bowel function has not changed. She denies any urinary tract symptoms. Cough has resolved. She is not getting any profuse night sweats.  Medications: reviewed  Allergies:  Allergies  Allergen Reactions  . Valium  (Diazepam)     causes hyperactivity    Review of Systems: Constitutional:   See above Respiratory: See above Cardiovascular:  No chest pain or palpitations Gastrointestinal: See above Genito-Urinary: See above Musculoskeletal: No muscle bone or joint pain Neurologic: No headache or change in vision Skin: No rash Remaining ROS negative.  Physical Exam: Blood pressure 113/69, pulse 91, temperature 97.1 F (36.2 C), temperature source Oral, resp. rate 18, height 5\' 5"  (1.651 m), weight 179 lb 6.4 oz (81.375 kg). Wt Readings from Last 3 Encounters:  08/04/12 179 lb 6.4 oz (81.375 kg)  05/19/12 185 lb 12.8 oz (84.278 kg)  01/14/12 187 lb 9.6 oz (85.095 kg)     General appearance: Well-nourished Caucasian woman HENNT: Pharynx no erythema exudate or ulcer Lymph nodes: No adenopathy Breasts: Lungs: Clear to auscultation resonant to percussion Heart: Regular rhythm no murmur Abdomen: Soft, nontender, no mass, both liver and spleen are enlarged Extremities: No edema, no calf tenderness Musculoskeletal: No joint deformities GU: Vascular: No cyanosis Neurologic: No focal deficit Skin: Spider hemangiomas. bib area  Lab Results: Lab Results  Component Value Date   WBC 4.2 08/04/2012   HGB 11.4* 08/04/2012   HCT 35.5 08/04/2012   MCV 85.5 08/04/2012   PLT 111* 08/04/2012     Chemistry      Component Value Date/Time   NA 135* 05/19/2012 1326   K 3.8 05/19/2012 1326   CL 108* 05/19/2012 1326   CO2 20* 05/19/2012 1326   BUN 11.6 05/19/2012 1326   CREATININE 0.9 05/19/2012 1326  Component Value Date/Time   CALCIUM 8.9 05/19/2012 1326   ALKPHOS 159* 05/19/2012 1326   AST 44* 05/19/2012 1326   ALT 28 05/19/2012 1326   BILITOT 1.46* 05/19/2012 1326        Impression: #1. Non-alcoholic steatohepatitis  #2. 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  #3. Iron malabsorption anemia Plan: I'm going to start checking blood counts on a monthly basis. She lives in [place]. It would be  more convenient to have her lab work and iron infusions given at [facility] and I will try to make arrangements. It does appear that she will need to have periodic iron infusions. In my experience, mostly to get IV iron have a prolonged response up to 1 year or more from a single replacement dose. This makes me think that there may be something else going on such as some chronic GI blood loss or a hemolytic process. Haptoglobin low when checked back in September but with liver dysfunction this may be unreliable. Reticulocyte count running slightly high at 2.5% today, 2.8% last month. Bilirubin borderline elevated but this may also be due to chronic hepatic dysfunction. I would consider possibility of PNH in my differential and we may need to do a PNH screen.  #4. Unexplained intermittent high fever and chills with no obvious source of infection on exam and no elevation of her white count above her average baseline. Given her chronic cirrhosis, there is a concern that she may have an underlying malignancy such as hepatocellular carcinoma. I'm going to go ahead and get a CT scan of her abdomen and pelvis at this time.  #5. History of diverticulitis     CC:. Dr. [name] at [facility]; Dr. Renford Dills   Levert Feinstein, MD 5/7/20148:10 PM

## 2012-08-05 NOTE — Telephone Encounter (Signed)
Called [facility] talked to [facility], she wanted labs appt changed , relayed information to Dr. Cyndie Chime nurse. Ct and  x-ray of chest scheduled , gave order to [facility] for precert , pt notified

## 2012-08-07 ENCOUNTER — Telehealth: Payer: Self-pay | Admitting: *Deleted

## 2012-08-07 NOTE — Telephone Encounter (Addendum)
Received vm call from pt stating that she is scheduled for some test at [facility] & she thinks she has a virus again that was discussed with Dr Cyndie Chime & asked it Dr Cyndie Chime wanted to add any lab work for Monday.  Note to Dr. Cyndie Chime.  Pt notified nothing to do per Dr. Cyndie Chime.  She reports that she is running fever & that Dr Cyndie Chime had suggested that she get blood cultures if she is running fever.  Discussed with Dr Cyndie Chime & infomed pt to go to ED while running fever & ask for blood cultures since she may not have fever by monday. She expressed understanding.

## 2012-08-10 ENCOUNTER — Ambulatory Visit (HOSPITAL_COMMUNITY)
Admission: RE | Admit: 2012-08-10 | Discharge: 2012-08-10 | Disposition: A | Discharge status: Home / Self Care | Payer: Managed Care, Other (non HMO) | Source: Ambulatory Visit | Attending: Oncology | Admitting: Oncology

## 2012-08-10 DIAGNOSIS — R188 Other ascites: Secondary | ICD-10-CM | POA: Insufficient documentation

## 2012-08-10 DIAGNOSIS — K766 Portal hypertension: Secondary | ICD-10-CM | POA: Insufficient documentation

## 2012-08-10 DIAGNOSIS — K7460 Unspecified cirrhosis of liver: Secondary | ICD-10-CM | POA: Insufficient documentation

## 2012-08-10 DIAGNOSIS — R509 Fever, unspecified: Secondary | ICD-10-CM

## 2012-08-10 DIAGNOSIS — E119 Type 2 diabetes mellitus without complications: Secondary | ICD-10-CM | POA: Insufficient documentation

## 2012-08-10 DIAGNOSIS — A689 Relapsing fever, unspecified: Secondary | ICD-10-CM | POA: Insufficient documentation

## 2012-08-10 LAB — POCT I-STAT, CHEM 8
BUN: 10 mg/dL (ref 6–23)
Calcium, Ion: 1.28 mmol/L (ref 1.13–1.30)
Chloride: 109 mEq/L (ref 96–112)
Creatinine, Ser: 0.7 mg/dL (ref 0.50–1.10)
TCO2: 26 mmol/L (ref 0–100)

## 2012-08-10 MED ORDER — IOHEXOL 300 MG/ML  SOLN
100.0000 mL | Freq: Once | INTRAMUSCULAR | Status: AC | PRN
  Administered 2012-08-10: 100 mL via INTRAVENOUS

## 2012-08-10 NOTE — Progress Notes (Signed)
Blood sample obtained from left arm IV for Creatnine level.r

## 2012-08-12 ENCOUNTER — Telehealth: Payer: Self-pay | Admitting: Dietician

## 2012-08-14 ENCOUNTER — Telehealth: Payer: Self-pay

## 2012-08-14 NOTE — Telephone Encounter (Signed)
Told patient results as noted below by Dr. Cyndie Chime.

## 2012-08-14 NOTE — Telephone Encounter (Signed)
Message copied by Lorine Bears on Fri Aug 14, 2012 11:49 AM ------      Message from: Sabino Snipes      Created: Fri Aug 14, 2012 10:48 AM                   ----- Message -----         From: Levert Feinstein, MD         Sent: 08/11/2012  12:08 PM           To: Orbie Hurst, RN, Sabino Snipes, RN, #            Call patient: no obvious reason for her fevers on CT abdomen or chest X-ray; chronic changes in liver; no tumors; no pneumonia ------

## 2012-08-31 ENCOUNTER — Other Ambulatory Visit (HOSPITAL_COMMUNITY): Payer: Managed Care, Other (non HMO)

## 2012-09-29 ENCOUNTER — Ambulatory Visit: Payer: Managed Care, Other (non HMO) | Admitting: Oncology

## 2012-09-30 ENCOUNTER — Other Ambulatory Visit (HOSPITAL_COMMUNITY): Payer: Managed Care, Other (non HMO)

## 2012-10-15 ENCOUNTER — Telehealth: Payer: Self-pay | Admitting: *Deleted

## 2012-10-15 NOTE — Telephone Encounter (Signed)
Received call from pt stating that she needs a lab appt before her MD appt on mon 10/19/12.  Discussed with her & she has not done any labs at [facility] since last visit, therefore we will get those labs here on Monday.  She knows to come in 30 min early.

## 2012-10-19 ENCOUNTER — Ambulatory Visit (HOSPITAL_BASED_OUTPATIENT_CLINIC_OR_DEPARTMENT_OTHER): Payer: Managed Care, Other (non HMO) | Admitting: Oncology

## 2012-10-19 ENCOUNTER — Other Ambulatory Visit (HOSPITAL_BASED_OUTPATIENT_CLINIC_OR_DEPARTMENT_OTHER): Payer: Managed Care, Other (non HMO) | Admitting: Lab

## 2012-10-19 ENCOUNTER — Telehealth: Payer: Self-pay | Admitting: Oncology

## 2012-10-19 VITALS — BP 116/71 | HR 80 | Temp 97.3°F | Resp 17 | Ht 65.0 in | Wt 181.5 lb

## 2012-10-19 DIAGNOSIS — D539 Nutritional anemia, unspecified: Secondary | ICD-10-CM

## 2012-10-19 DIAGNOSIS — R509 Fever, unspecified: Secondary | ICD-10-CM

## 2012-10-19 DIAGNOSIS — K7581 Nonalcoholic steatohepatitis (NASH): Secondary | ICD-10-CM

## 2012-10-19 DIAGNOSIS — D61818 Other pancytopenia: Secondary | ICD-10-CM

## 2012-10-19 DIAGNOSIS — K7689 Other specified diseases of liver: Secondary | ICD-10-CM

## 2012-10-19 LAB — CBC & DIFF AND RETIC
BASO%: 0.8 % (ref 0.0–2.0)
Basophils Absolute: 0 10*3/uL (ref 0.0–0.1)
EOS%: 4.6 % (ref 0.0–7.0)
HGB: 8.9 g/dL — ABNORMAL LOW (ref 11.6–15.9)
Immature Retic Fract: 12.3 % — ABNORMAL HIGH (ref 1.60–10.00)
MCH: 27.3 pg (ref 25.1–34.0)
MONO#: 0.1 10*3/uL (ref 0.1–0.9)
RDW: 14.5 % (ref 11.2–14.5)
Retic Ct Abs: 75.31 10*3/uL (ref 33.70–90.70)
WBC: 2.4 10*3/uL — ABNORMAL LOW (ref 3.9–10.3)
lymph#: 0.7 10*3/uL — ABNORMAL LOW (ref 0.9–3.3)

## 2012-10-21 ENCOUNTER — Telehealth: Payer: Self-pay | Admitting: *Deleted

## 2012-10-21 ENCOUNTER — Telehealth: Payer: Self-pay | Admitting: Oncology

## 2012-10-21 LAB — AFP TUMOR MARKER: AFP-Tumor Marker: 3.7 ng/mL (ref 0.0–8.0)

## 2012-10-21 NOTE — Telephone Encounter (Signed)
lmonvm for pt confirming next appt for 7/24. Schedule mailed.

## 2012-10-21 NOTE — Telephone Encounter (Signed)
Patient wanted to move appt from tomorrow to Monday. OK per desk RN.  Edmonia Caprio Lacretia Nicks

## 2012-10-22 ENCOUNTER — Ambulatory Visit: Payer: Managed Care, Other (non HMO)

## 2012-10-23 ENCOUNTER — Telehealth: Payer: Self-pay | Admitting: Oncology

## 2012-10-23 NOTE — Telephone Encounter (Signed)
Called pt and left message regarding appt for 7/28 and the rest of the year, advised pt to get appt calendar

## 2012-10-23 NOTE — Progress Notes (Signed)
Hematology and Oncology Follow Up Visit  Jillian Sanders [id number] Mar 19, 1951 61 y.o. 10/23/2012 8:15 AM   Principle Diagnosis: Encounter Diagnoses  Name Primary?  Marland Kitchen Unspecified deficiency anemia Yes  . Other pancytopenia   . Nonalcoholic steatohepatitis      Interim History:   Short interim followup visit for this 62 year old woman with history of NASH with resulting cirrhosis and splenomegaly. She has chronic, iron deficiency anemia, not responsive to oral iron. Recent trial of full dose oral iron unsuccessful and I had to resume parenteral iron infusions. Her iron requirements are very high and her pattern over the last year shows that she requires an infusion every 2 months. She does not note any hematochezia or melena. Upper and lower endoscopy procedures have been unremarkable in the past. She never had a capsule endoscopy. I suspect that she is having chronic GI blood loss. Iron panel checked in April showed ferritin down to 11. Repeat panel today with ferritin 12. In the interim, she did receive an iron infusion in April and ferritin rapidly came up to 123 by May 6. Hemoglobin was 7.9 and rose to 11.4 and has now dropped to 8.9. There is some element of chronic pancytopenia due to her cirrhosis and splenomegaly. White count today 2400 with 59% neutrophils, 30% lymphocytes, and platelet count 75,000.  At time of most recent visit here, she reported intermittent high fevers. There were no focal findings on exam and she was afebrile at the time of the visit. I obtained a CT scan of the abdomen and pelvis done on 08/04/2012 to look for any occult abscess, hemorrhage, or pyelonephritis and the study was unremarkable except for chronic changes related to cirrhosis. Previous hysterectomy noted. 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 Some nonspecific rectal wall thickening in the rectosigmoid area. Small hiatus  hernia.  Medications: reviewed  Allergies:  Allergies  Allergen Reactions  . Valium (Diazepam)     causes hyperactivity    Review of Systems: Constitutional:   Chronic fatigue. Unexplained fevers have resolved. Respiratory: No cough or dyspnea Cardiovascular:  No chest pain or palpitations Gastrointestinal: No abdominal pain or change in bowel habit Genito-Urinary: No urinary tract symptoms. No vaginal bleeding. No hematuria Musculoskeletal: Neurologic: Skin: Remaining ROS negative.  Physical Exam: Blood pressure 116/71, pulse 80, temperature 97.3 F (36.3 C), temperature source Oral, resp. rate 17, height 5\' 5"  (1.651 m), weight 181 lb 8 oz (82.328 kg). Wt Readings from Last 3 Encounters:  10/19/12 181 lb 8 oz (82.328 kg)  08/04/12 179 lb 6.4 oz (81.375 kg)  05/19/12 185 lb 12.8 oz (84.278 kg)     General appearance: Well-nourished Caucasian woman HENNT: Pharynx no erythema or exudate Lymph nodes: No lymphadenopathy Breasts: Lungs: Clear to auscultation resonant to percussion Heart: Regular rhythm no murmur Abdomen: Soft, nontender, no mass, no organomegaly Extremities: No edema, no calf tenderness Musculoskeletal: No joint deformities GU: Vascular: No carotid bruits, no cyanosis Neurologic: Motor strength 5 over 5, reflexes 1+ symmetric Skin: No rash or ecchymosis  Lab Results: Lab Results  Component Value Date   WBC 2.4* 10/19/2012   HGB 8.9* 10/19/2012   HCT 27.7* 10/19/2012   MCV 85.0 10/19/2012   PLT 75* 10/19/2012     Chemistry      Component Value Date/Time   NA 141 08/10/2012 1345   NA 135* 05/19/2012 1326   K 3.8 08/10/2012 1345   K 3.8 05/19/2012 1326   CL 109 08/10/2012 1345  CL 108* 05/19/2012 1326   CO2 20* 05/19/2012 1326   BUN 10 08/10/2012 1345   BUN 11.6 05/19/2012 1326   CREATININE 0.70 08/10/2012 1345   CREATININE 0.9 05/19/2012 1326      Component Value Date/Time   CALCIUM 8.9 05/19/2012 1326   ALKPHOS 159* 05/19/2012 1326   AST 44*  05/19/2012 1326   ALT 28 05/19/2012 1326   BILITOT 1.46* 05/19/2012 1326       Radiological Studies: See discussion above   Impression:  #1.NASH  #2. Cirrhosis secondary to #1  #3. Splenomegaly secondary to #2  #4. Chronic pancytopenia secondary to #2 and 3  #5. Iron malabsorption anemia Suspicion for chronic GI blood loss due to frequent need for parenteral iron infusions on an every 2-3 month basis. It might be productive to get a capsule endoscopy study in the future. She may have an element of low-grade hemolysis but this is hard to determine in face of liver disease since many of the parameters of hemolysis are obscured by her liver dysfunction. Reticulocyte counts running 2.3-2.8%, haptoglobin  low at 34 but haptoglobin is made in the liver, bilirubin borderline elevated highest value of 1.5 which is also likely due  to her liver disease.  #6. Recent unexplained fevers which have resolved  #7. History of diverticulitis  Plan:   CC:. Dr. Renford Dills; Dr. [name] at [facility], MD 7/25/20148:15 AM

## 2012-10-26 ENCOUNTER — Ambulatory Visit (HOSPITAL_BASED_OUTPATIENT_CLINIC_OR_DEPARTMENT_OTHER): Payer: Managed Care, Other (non HMO)

## 2012-10-26 VITALS — BP 133/55 | HR 71 | Temp 98.1°F | Resp 18

## 2012-10-26 DIAGNOSIS — D509 Iron deficiency anemia, unspecified: Secondary | ICD-10-CM

## 2012-10-26 DIAGNOSIS — D539 Nutritional anemia, unspecified: Secondary | ICD-10-CM

## 2012-10-26 MED ORDER — SODIUM CHLORIDE 0.9 % IV SOLN
1020.0000 mg | Freq: Once | INTRAVENOUS | Status: AC
  Administered 2012-10-26: 1020 mg via INTRAVENOUS
  Filled 2012-10-26: qty 34

## 2012-10-26 NOTE — Patient Instructions (Addendum)
  FERUMOXYTOL is an iron complex. Iron is used to make healthy red blood cells, which carry oxygen and nutrients throughout the body. This medicine is used to treat iron deficiency anemia in people with chronic kidney disease. This medicine may be used for other purposes; ask your health care provider or pharmacist if you have questions. What should I tell my health care provider before I take this medicine? They need to know if you have any of these conditions: -anemia not caused by low iron levels -high levels of iron in the blood -magnetic resonance imaging (MRI) test scheduled -an unusual or allergic reaction to iron, other medicines, foods, dyes, or preservatives -pregnant or trying to get pregnant -breast-feeding How should I use this medicine? This medicine is for infusion into a vein. It is given by a health care professional in a hospital or clinic setting. Talk to your pediatrician regarding the use of this medicine in children. Special care may be needed. Overdosage: If you think you've taken too much of this medicine contact a poison control center or emergency room at once. Overdosage: If you think you have taken too much of this medicine contact a poison control center or emergency room at once. NOTE: This medicine is only for you. Do not share this medicine with others. What if I miss a dose? It is important not to miss your dose. Call your doctor or health care professional if you are unable to keep an appointment. What may interact with this medicine? This medicine may interact with the following medications: -other iron products This list may not describe all possible interactions. Give your health care provider a list of all the medicines, herbs, non-prescription drugs, or dietary supplements you use. Also tell them if you smoke, drink alcohol, or use illegal drugs. Some items may interact with your medicine. What should I watch for while using this medicine? Visit your  doctor or healthcare professional regularly. Tell your doctor or healthcare professional if your symptoms do not start to get better or if they get worse. You may need blood work done while you are taking this medicine. You may need to follow a special diet. Talk to your doctor. Foods that contain iron include: whole grains/cereals, dried fruits, beans, or peas, leafy green vegetables, and organ meats (liver, kidney). What side effects may I notice from receiving this medicine? Side effects that you should report to your doctor or health care professional as soon as possible: -allergic reactions like skin rash, itching or hives, swelling of the face, lips, or tongue -breathing problems -changes in blood pressure -feeling faint or lightheaded, falls -fever or chills -flushing, sweating, or hot feelings -swelling of the ankles or feet Side effects that usually do not require medical attention (Report these to your doctor or health care professional if they continue or are bothersome.): -diarrhea -headache -nausea, vomiting -stomach pain This list may not describe all possible side effects. Call your doctor for medical advice about side effects. You may report side effects to FDA at 1-800-FDA-1088. Where should I keep my medicine? This drug is given in a hospital or clinic and will not be stored at home. NOTE: This sheet is a summary. It may not cover all possible information. If you have questions about this medicine, talk to your doctor, pharmacist, or health care provider.  2012, Elsevier/Gold Standard. (12/09/2007 9:48:25 PM) 

## 2012-11-17 ENCOUNTER — Other Ambulatory Visit (HOSPITAL_COMMUNITY): Payer: Self-pay | Admitting: Internal Medicine

## 2012-11-17 DIAGNOSIS — R5381 Other malaise: Secondary | ICD-10-CM

## 2012-11-17 DIAGNOSIS — K7460 Unspecified cirrhosis of liver: Secondary | ICD-10-CM

## 2012-11-17 DIAGNOSIS — R188 Other ascites: Secondary | ICD-10-CM

## 2012-11-17 DIAGNOSIS — R109 Unspecified abdominal pain: Secondary | ICD-10-CM

## 2012-11-19 ENCOUNTER — Ambulatory Visit (HOSPITAL_COMMUNITY)
Admission: RE | Admit: 2012-11-19 | Discharge: 2012-11-19 | Disposition: A | Discharge status: Home / Self Care | Payer: Managed Care, Other (non HMO) | Source: Ambulatory Visit | Attending: Internal Medicine | Admitting: Internal Medicine

## 2012-11-19 DIAGNOSIS — R5383 Other fatigue: Secondary | ICD-10-CM | POA: Insufficient documentation

## 2012-11-19 DIAGNOSIS — R5381 Other malaise: Secondary | ICD-10-CM | POA: Insufficient documentation

## 2012-11-19 DIAGNOSIS — R188 Other ascites: Secondary | ICD-10-CM | POA: Insufficient documentation

## 2012-11-19 DIAGNOSIS — R109 Unspecified abdominal pain: Secondary | ICD-10-CM | POA: Insufficient documentation

## 2012-11-19 DIAGNOSIS — K7460 Unspecified cirrhosis of liver: Secondary | ICD-10-CM | POA: Insufficient documentation

## 2012-11-19 NOTE — Procedures (Signed)
[place] of abdomen finds very small pocket of ascites in RUQ adjacent to liver. Unsuccessful attempt at paracentesis due to a loop of small bowel within the only potential access window.   No immediate complications.  Pt tolerated well.     Brayton El PA-C 11/19/2012 11:13 AM

## 2012-12-23 ENCOUNTER — Ambulatory Visit (HOSPITAL_BASED_OUTPATIENT_CLINIC_OR_DEPARTMENT_OTHER): Payer: Managed Care, Other (non HMO)

## 2012-12-23 ENCOUNTER — Ambulatory Visit (HOSPITAL_BASED_OUTPATIENT_CLINIC_OR_DEPARTMENT_OTHER): Payer: Managed Care, Other (non HMO) | Admitting: Nurse Practitioner

## 2012-12-23 ENCOUNTER — Other Ambulatory Visit (HOSPITAL_BASED_OUTPATIENT_CLINIC_OR_DEPARTMENT_OTHER): Payer: Managed Care, Other (non HMO)

## 2012-12-23 VITALS — BP 136/45 | HR 71 | Temp 98.6°F | Resp 18 | Ht 65.0 in | Wt 184.9 lb

## 2012-12-23 DIAGNOSIS — D539 Nutritional anemia, unspecified: Secondary | ICD-10-CM

## 2012-12-23 DIAGNOSIS — K7581 Nonalcoholic steatohepatitis (NASH): Secondary | ICD-10-CM

## 2012-12-23 DIAGNOSIS — D509 Iron deficiency anemia, unspecified: Secondary | ICD-10-CM

## 2012-12-23 DIAGNOSIS — D61818 Other pancytopenia: Secondary | ICD-10-CM

## 2012-12-23 DIAGNOSIS — K7689 Other specified diseases of liver: Secondary | ICD-10-CM

## 2012-12-23 DIAGNOSIS — D518 Other vitamin B12 deficiency anemias: Secondary | ICD-10-CM

## 2012-12-23 LAB — CBC WITH DIFFERENTIAL/PLATELET
Basophils Absolute: 0 10*3/uL (ref 0.0–0.1)
EOS%: 3.9 % (ref 0.0–7.0)
HCT: 29.8 % — ABNORMAL LOW (ref 34.8–46.6)
HGB: 9.9 g/dL — ABNORMAL LOW (ref 11.6–15.9)
LYMPH%: 22.8 % (ref 14.0–49.7)
MCH: 29.9 pg (ref 25.1–34.0)
MCV: 90 fL (ref 79.5–101.0)
MONO%: 6.8 % (ref 0.0–14.0)
NEUT%: 66 % (ref 38.4–76.8)

## 2012-12-23 MED ORDER — SODIUM CHLORIDE 0.9 % IV SOLN
1020.0000 mg | Freq: Once | INTRAVENOUS | Status: AC
  Administered 2012-12-23: 1020 mg via INTRAVENOUS
  Filled 2012-12-23: qty 34

## 2012-12-23 MED ORDER — SODIUM CHLORIDE 0.9 % IV SOLN
INTRAVENOUS | Status: DC
  Administered 2012-12-23: 15:00:00 via INTRAVENOUS

## 2012-12-23 NOTE — Progress Notes (Signed)
OFFICE PROGRESS NOTE  Interval history:  Jillian Sanders is a 62 year old woman with a history of NASH with resultant cirrhosis and splenomegaly. She has chronic iron deficiency anemia not responsive to oral iron. She typically receives an iron infusion every 2 months. Last iron infusion was given on 10/26/2012 with Feraheme. Hemoglobin at that time was 8.9 and ferritin was 12.  She is seen today for scheduled followup prior to an iron infusion. Following the iron infusion in July she noted an increase in her energy level. The energy level has again declined and she is craving ice. She denies any known bleeding. No hematochezia or melena. She denies shortness of breath. No chest pain. She has had no fever or chills over the past few months. No hematuria or dysuria.   Objective: Blood pressure 136/45, pulse 71, temperature 98.6 F (37 C), temperature source Oral, resp. rate 18, height 5\' 5"  (1.651 m), weight 184 lb 14.4 oz (83.87 kg).  Oropharynx is without thrush or ulceration. No palpable cervical, supraclavicular or axillary lymph nodes. Lungs are clear. No wheezes or rales. Regular cardiac rhythm. 2/6 systolic murmur. Abdomen is soft and nontender. Spleen palpable 4-5 fingerbreadths below the left costal margin. No leg edema. Spider angiomas scattered over the chest and upper back.   Lab Results: Lab Results  Component Value Date   WBC 4.1 12/23/2012   HGB 9.9* 12/23/2012   HCT 29.8* 12/23/2012   MCV 90.0 12/23/2012   PLT 89* 12/23/2012    Chemistry:    Chemistry      Component Value Date/Time   NA 141 08/10/2012 1345   NA 135* 05/19/2012 1326   K 3.8 08/10/2012 1345   K 3.8 05/19/2012 1326   CL 109 08/10/2012 1345   CL 108* 05/19/2012 1326   CO2 20* 05/19/2012 1326   BUN 10 08/10/2012 1345   BUN 11.6 05/19/2012 1326   CREATININE 0.70 08/10/2012 1345   CREATININE 0.9 05/19/2012 1326      Component Value Date/Time   CALCIUM 8.9 05/19/2012 1326   ALKPHOS 159* 05/19/2012 1326   AST 44*  05/19/2012 1326   ALT 28 05/19/2012 1326   BILITOT 1.46* 05/19/2012 1326       Studies/Results: No results found.  Medications: I have reviewed the patient's current medications.  Assessment/Plan:  1. NASH. 2. Cirrhosis secondary to #1. 3. Splenomegaly secondary to #2. 4. Chronic pancytopenia secondary to #2 and #3. 5. 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 6. Unexplained fevers earlier this year. Continues to be resolved.  Disposition-suspect she is again iron deficient. We will proceed with Feraheme today as scheduled. She has a followup visit with labs and an iron infusion in 2 months. She will contact the office in the interim with any problems.  Plan reviewed with Dr. Cyndie Chime.  Jillian Sanders ANP/GNP-BC

## 2012-12-23 NOTE — Patient Instructions (Signed)
Ferumoxytol injection What is this medicine? FERUMOXYTOL is an iron complex. Iron is used to make healthy red blood cells, which carry oxygen and nutrients throughout the body. This medicine is used to treat iron deficiency anemia in people with chronic kidney disease. This medicine may be used for other purposes; ask your health care provider or pharmacist if you have questions. What should I tell my health care provider before I take this medicine? They need to know if you have any of these conditions: -anemia not caused by low iron levels -high levels of iron in the blood -magnetic resonance imaging (MRI) test scheduled -an unusual or allergic reaction to iron, other medicines, foods, dyes, or preservatives -pregnant or trying to get pregnant -breast-feeding How should I use this medicine? This medicine is for infusion into a vein. It is given by a health care professional in a hospital or clinic setting. Talk to your pediatrician regarding the use of this medicine in children. Special care may be needed. Overdosage: If you think you've taken too much of this medicine contact a poison control center or emergency room at once. Overdosage: If you think you have taken too much of this medicine contact a poison control center or emergency room at once. NOTE: This medicine is only for you. Do not share this medicine with others. What if I miss a dose? It is important not to miss your dose. Call your doctor or health care professional if you are unable to keep an appointment. What may interact with this medicine? This medicine may interact with the following medications: -other iron products This list may not describe all possible interactions. Give your health care provider a list of all the medicines, herbs, non-prescription drugs, or dietary supplements you use. Also tell them if you smoke, drink alcohol, or use illegal drugs. Some items may interact with your medicine. What should I watch  for while using this medicine? Visit your doctor or healthcare professional regularly. Tell your doctor or healthcare professional if your symptoms do not start to get better or if they get worse. You may need blood work done while you are taking this medicine. You may need to follow a special diet. Talk to your doctor. Foods that contain iron include: whole grains/cereals, dried fruits, beans, or peas, leafy green vegetables, and organ meats (liver, kidney). What side effects may I notice from receiving this medicine? Side effects that you should report to your doctor or health care professional as soon as possible: -allergic reactions like skin rash, itching or hives, swelling of the face, lips, or tongue -breathing problems -changes in blood pressure -feeling faint or lightheaded, falls -fever or chills -flushing, sweating, or hot feelings -swelling of the ankles or feet Side effects that usually do not require medical attention (Report these to your doctor or health care professional if they continue or are bothersome.): -diarrhea -headache -nausea, vomiting -stomach pain This list may not describe all possible side effects. Call your doctor for medical advice about side effects. You may report side effects to FDA at 1-800-FDA-1088. Where should I keep my medicine? This drug is given in a hospital or clinic and will not be stored at home. NOTE: This sheet is a summary. It may not cover all possible information. If you have questions about this medicine, talk to your doctor, pharmacist, or health care provider.  2013, Elsevier/Gold Standard. (12/09/2007 9:48:25 PM)  

## 2013-02-02 ENCOUNTER — Encounter: Payer: Self-pay | Admitting: Oncology

## 2013-02-02 NOTE — Progress Notes (Signed)
Put disability form on nurse's desk. °

## 2013-02-03 ENCOUNTER — Encounter: Payer: Self-pay | Admitting: Oncology

## 2013-02-03 NOTE — Progress Notes (Signed)
Faxed disability form to [employer] @ [phone] °

## 2013-02-04 ENCOUNTER — Other Ambulatory Visit: Payer: Self-pay

## 2013-02-16 ENCOUNTER — Ambulatory Visit (HOSPITAL_BASED_OUTPATIENT_CLINIC_OR_DEPARTMENT_OTHER): Payer: Managed Care, Other (non HMO) | Admitting: Oncology

## 2013-02-16 ENCOUNTER — Other Ambulatory Visit (HOSPITAL_BASED_OUTPATIENT_CLINIC_OR_DEPARTMENT_OTHER): Payer: Managed Care, Other (non HMO) | Admitting: Lab

## 2013-02-16 ENCOUNTER — Telehealth: Payer: Self-pay | Admitting: Oncology

## 2013-02-16 ENCOUNTER — Ambulatory Visit (HOSPITAL_BASED_OUTPATIENT_CLINIC_OR_DEPARTMENT_OTHER): Payer: Managed Care, Other (non HMO)

## 2013-02-16 ENCOUNTER — Telehealth: Payer: Self-pay | Admitting: *Deleted

## 2013-02-16 VITALS — BP 128/61 | HR 76 | Temp 98.6°F | Resp 18 | Ht 65.0 in | Wt 184.1 lb

## 2013-02-16 DIAGNOSIS — D61818 Other pancytopenia: Secondary | ICD-10-CM

## 2013-02-16 DIAGNOSIS — K7581 Nonalcoholic steatohepatitis (NASH): Secondary | ICD-10-CM

## 2013-02-16 DIAGNOSIS — D539 Nutritional anemia, unspecified: Secondary | ICD-10-CM

## 2013-02-16 DIAGNOSIS — K766 Portal hypertension: Secondary | ICD-10-CM

## 2013-02-16 DIAGNOSIS — K7689 Other specified diseases of liver: Secondary | ICD-10-CM

## 2013-02-16 DIAGNOSIS — I8510 Secondary esophageal varices without bleeding: Secondary | ICD-10-CM

## 2013-02-16 DIAGNOSIS — R161 Splenomegaly, not elsewhere classified: Secondary | ICD-10-CM

## 2013-02-16 DIAGNOSIS — I8500 Esophageal varices without bleeding: Secondary | ICD-10-CM

## 2013-02-16 LAB — CBC WITH DIFFERENTIAL/PLATELET
BASO%: 0.9 % (ref 0.0–2.0)
Eosinophils Absolute: 0.1 10*3/uL (ref 0.0–0.5)
LYMPH%: 22.9 % (ref 14.0–49.7)
MCHC: 33.5 g/dL (ref 31.5–36.0)
MCV: 91.7 fL (ref 79.5–101.0)
MONO%: 6.5 % (ref 0.0–14.0)
Platelets: 85 10*3/uL — ABNORMAL LOW (ref 145–400)
RBC: 3.48 10*6/uL — ABNORMAL LOW (ref 3.70–5.45)

## 2013-02-16 LAB — FERRITIN CHCC: Ferritin: 46 ng/ml (ref 9–269)

## 2013-02-16 MED ORDER — SODIUM CHLORIDE 0.9 % IV SOLN
1020.0000 mg | Freq: Once | INTRAVENOUS | Status: AC
  Administered 2013-02-16: 1020 mg via INTRAVENOUS
  Filled 2013-02-16: qty 34

## 2013-02-16 MED ORDER — SODIUM CHLORIDE 0.9 % IV SOLN
Freq: Once | INTRAVENOUS | Status: AC
  Administered 2013-02-16: 16:00:00 via INTRAVENOUS

## 2013-02-16 NOTE — Telephone Encounter (Signed)
appts made per 11/18 POF AVS and CAL printed shh

## 2013-02-16 NOTE — Patient Instructions (Signed)

## 2013-02-16 NOTE — Telephone Encounter (Signed)
Per staff message and POF I have scheduled appts.  JMW  

## 2013-02-21 NOTE — Progress Notes (Signed)
Hematology and Oncology Follow Up Visit  Jillian Sanders [id number] [birth year] 62 y.o. 02/21/2013 2:38 PM   Principle Diagnosis: Encounter Diagnoses  Name Primary?  Marland Kitchen Unspecified deficiency anemia   . Other pancytopenia   . Nonalcoholic steatohepatitis Yes  . Esophageal varices in cirrhosis   . Portal hypertension with esophageal varices       Interim History:    Followup visit for this 62 year old woman with history of NASH with resulting cirrhosis , splenomegaly, and chronic pancytopenia.. She has chronic, iron deficiency anemia, not responsive to oral iron. Recent trial of full dose oral iron unsuccessful and I had to resume parenteral iron infusions. Her iron requirements are very high and her pattern over the last year shows that she requires an infusion every 2 months. She does not note any hematochezia or melena. Upper and lower endoscopy procedures have been unremarkable in the past. She never had a capsule endoscopy. I suspect that she is having chronic GI blood loss. Iron panel checked in April showed ferritin down to 11.She  received an iron infusion in April and ferritin rapidly came up to 123 by May 6. Hemoglobin was 7.9 and rose to 11.4 but fell down again to 8.9 by July 21. Additional iron given at that time.  She has had no interim medical problems. She feels much better since we have put her on an every other month iron infusion program.   Medications: reviewed  Allergies:  Allergies  Allergen Reactions  . Valium [Diazepam]     causes hyperactivity    Review of Systems: Hematology:  No overt bleeding ENT ROS: No sore throat Respiratory ROS: No cough or dyspnea Cardiovascular ROS: No chest pain or palpitations Gastrointestinal ROS:   No abdominal pain or change in bowel habits. No hematochezia. No melena Genito-Urinary ROS: No urinary tract symptoms Musculoskeletal ROS: No muscle bone or joint pain Neurological ROS: No headache or change in  vision Dermatological ROS: No rash or ecchymosis Remaining ROS negative.  Physical Exam: Blood pressure 128/61, pulse 76, temperature 98.6 F (37 C), temperature source Oral, resp. rate 18, height 5\' 5"  (1.651 m), weight 184 lb 1.6 oz (83.507 kg). Wt Readings from Last 3 Encounters:  02/16/13 184 lb 1.6 oz (83.507 kg)  12/23/12 184 lb 14.4 oz (83.87 kg)  10/19/12 181 lb 8 oz (82.328 kg)     General appearance: Well-nourished Caucasian woman HENNT: Pharynx no erythema, exudate, mass, or ulcer. No thyromegaly or thyroid nodules Lymph nodes: No cervical, supraclavicular, or axillary lymphadenopathy Breasts:  Lungs: Clear to auscultation, resonant to percussion throughout Heart: Regular rhythm, no murmur, no gallop, no rub, no click, no edema Abdomen: Soft, nontender, normal bowel sounds, no mass, liver and spleen both enlarged unchanged Extremities: No edema, no calf tenderness Musculoskeletal: no joint deformities GU:  Vascular: Carotid pulses 2+, no bruits,  Neurologic: Alert, oriented, PERRLA, , cranial nerves grossly normal, motor strength 5 over 5, reflexes 1+ symmetric, upper body coordination normal, gait normal, Skin: Typical spider hemangiomas in the bib area of her chest  no  ecchymosis  Lab Results: CBC W/Diff    Component Value Date/Time   WBC 2.8* 02/16/2013 1323   WBC 3.7* 12/23/2011 0840   RBC 3.48* 02/16/2013 1323   RBC 3.60* 12/23/2011 0840   HGB 10.7* 02/16/2013 1323   HGB 11.2* 08/10/2012 1345   HCT 32.0* 02/16/2013 1323   HCT 33.0* 08/10/2012 1345   PLT 85* 02/16/2013 1323   PLT 80* 12/23/2011 0840   MCV 91.7  02/16/2013 1323   MCV 88.3 12/23/2011 0840   MCH 30.7 02/16/2013 1323   MCH 30.6 12/23/2011 0840   MCHC 33.5 02/16/2013 1323   MCHC 34.6 12/23/2011 0840   RDW 14.0 02/16/2013 1323   RDW 14.1 12/23/2011 0840   LYMPHSABS 0.6* 02/16/2013 1323   MONOABS 0.2 02/16/2013 1323   EOSABS 0.1 02/16/2013 1323   BASOSABS 0.0 02/16/2013 1323     Chemistry       Component Value Date/Time   NA 141 08/10/2012 1345   NA 135* 05/19/2012 1326   K 3.8 08/10/2012 1345   K 3.8 05/19/2012 1326   CL 109 08/10/2012 1345   CL 108* 05/19/2012 1326   CO2 20* 05/19/2012 1326   BUN 10 08/10/2012 1345   BUN 11.6 05/19/2012 1326   CREATININE 0.70 08/10/2012 1345   CREATININE 0.9 05/19/2012 1326      Component Value Date/Time   CALCIUM 8.9 05/19/2012 1326   ALKPHOS 159* 05/19/2012 1326   AST 44* 05/19/2012 1326   ALT 28 05/19/2012 1326   BILITOT 1.46* 05/19/2012 1326      Impression:  #1. NASH resulting in cirrhosis  #2. Pancytopenia secondary to #1  #3. Splenomegaly secondary to #1  #4. 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 History of diverticulitis  Plan: Continue every other month iron infusions. She will receive an infusion today. Monitor CBCs and ferritin levels.   CC: Patient Care Team: Katy Apo, MD as PCP - General (Internal Medicine)   Levert Feinstein, MD 11/23/20142:38 PM

## 2013-03-03 ENCOUNTER — Ambulatory Visit (INDEPENDENT_AMBULATORY_CARE_PROVIDER_SITE_OTHER): Payer: Managed Care, Other (non HMO) | Admitting: Ophthalmology

## 2013-04-05 ENCOUNTER — Ambulatory Visit (INDEPENDENT_AMBULATORY_CARE_PROVIDER_SITE_OTHER): Payer: Self-pay | Admitting: Ophthalmology

## 2013-04-20 ENCOUNTER — Telehealth: Payer: Self-pay | Admitting: *Deleted

## 2013-04-20 ENCOUNTER — Ambulatory Visit (HOSPITAL_BASED_OUTPATIENT_CLINIC_OR_DEPARTMENT_OTHER): Payer: Managed Care, Other (non HMO) | Admitting: Nurse Practitioner

## 2013-04-20 ENCOUNTER — Ambulatory Visit (HOSPITAL_BASED_OUTPATIENT_CLINIC_OR_DEPARTMENT_OTHER): Payer: Managed Care, Other (non HMO)

## 2013-04-20 ENCOUNTER — Other Ambulatory Visit (HOSPITAL_BASED_OUTPATIENT_CLINIC_OR_DEPARTMENT_OTHER): Payer: Managed Care, Other (non HMO)

## 2013-04-20 ENCOUNTER — Telehealth: Payer: Self-pay | Admitting: Oncology

## 2013-04-20 VITALS — BP 127/56 | HR 66 | Temp 98.0°F | Resp 19 | Ht 65.0 in | Wt 187.4 lb

## 2013-04-20 DIAGNOSIS — D61818 Other pancytopenia: Secondary | ICD-10-CM

## 2013-04-20 DIAGNOSIS — K7581 Nonalcoholic steatohepatitis (NASH): Secondary | ICD-10-CM

## 2013-04-20 DIAGNOSIS — D509 Iron deficiency anemia, unspecified: Secondary | ICD-10-CM

## 2013-04-20 DIAGNOSIS — D539 Nutritional anemia, unspecified: Secondary | ICD-10-CM

## 2013-04-20 DIAGNOSIS — D518 Other vitamin B12 deficiency anemias: Secondary | ICD-10-CM

## 2013-04-20 DIAGNOSIS — K7689 Other specified diseases of liver: Secondary | ICD-10-CM

## 2013-04-20 DIAGNOSIS — R161 Splenomegaly, not elsewhere classified: Secondary | ICD-10-CM

## 2013-04-20 LAB — COMPREHENSIVE METABOLIC PANEL (CC13)
ALBUMIN: 2.8 g/dL — AB (ref 3.5–5.0)
ALK PHOS: 211 U/L — AB (ref 40–150)
ALT: 29 U/L (ref 0–55)
AST: 43 U/L — AB (ref 5–34)
Anion Gap: 8 mEq/L (ref 3–11)
BUN: 9 mg/dL (ref 7.0–26.0)
CALCIUM: 8.4 mg/dL (ref 8.4–10.4)
CHLORIDE: 107 meq/L (ref 98–109)
CO2: 22 mEq/L (ref 22–29)
Creatinine: 0.7 mg/dL (ref 0.6–1.1)
GLUCOSE: 227 mg/dL — AB (ref 70–140)
POTASSIUM: 4 meq/L (ref 3.5–5.1)
SODIUM: 137 meq/L (ref 136–145)
TOTAL PROTEIN: 6.3 g/dL — AB (ref 6.4–8.3)
Total Bilirubin: 1.7 mg/dL — ABNORMAL HIGH (ref 0.20–1.20)

## 2013-04-20 LAB — CBC WITH DIFFERENTIAL/PLATELET
BASO%: 0.9 % (ref 0.0–2.0)
Basophils Absolute: 0 10*3/uL (ref 0.0–0.1)
EOS%: 4.5 % (ref 0.0–7.0)
Eosinophils Absolute: 0.1 10*3/uL (ref 0.0–0.5)
HEMATOCRIT: 31.2 % — AB (ref 34.8–46.6)
HGB: 10.3 g/dL — ABNORMAL LOW (ref 11.6–15.9)
LYMPH%: 24.3 % (ref 14.0–49.7)
MCH: 31.2 pg (ref 25.1–34.0)
MCHC: 33.1 g/dL (ref 31.5–36.0)
MCV: 94.1 fL (ref 79.5–101.0)
MONO#: 0.2 10*3/uL (ref 0.1–0.9)
MONO%: 7 % (ref 0.0–14.0)
NEUT#: 1.7 10*3/uL (ref 1.5–6.5)
NEUT%: 63.3 % (ref 38.4–76.8)
PLATELETS: 88 10*3/uL — AB (ref 145–400)
RBC: 3.32 10*6/uL — AB (ref 3.70–5.45)
RDW: 14.2 % (ref 11.2–14.5)
WBC: 2.8 10*3/uL — ABNORMAL LOW (ref 3.9–10.3)
lymph#: 0.7 10*3/uL — ABNORMAL LOW (ref 0.9–3.3)

## 2013-04-20 LAB — FERRITIN CHCC: FERRITIN: 74 ng/mL (ref 9–269)

## 2013-04-20 LAB — LACTATE DEHYDROGENASE (CC13): LDH: 173 U/L (ref 125–245)

## 2013-04-20 MED ORDER — SODIUM CHLORIDE 0.9 % IV SOLN
INTRAVENOUS | Status: DC
  Administered 2013-04-20: 15:00:00 via INTRAVENOUS

## 2013-04-20 MED ORDER — SODIUM CHLORIDE 0.9 % IV SOLN
1020.0000 mg | Freq: Once | INTRAVENOUS | Status: AC
  Administered 2013-04-20: 1020 mg via INTRAVENOUS
  Filled 2013-04-20: qty 34

## 2013-04-20 NOTE — Telephone Encounter (Signed)
Gave pt appt for lab and ML appt for MArch, May emailed Sharyn Lull regarding IV Iron

## 2013-04-20 NOTE — Telephone Encounter (Signed)
Per staff phone call and POF I have schedueld appts.  JMW  

## 2013-04-20 NOTE — Progress Notes (Signed)
OFFICE PROGRESS NOTE  Interval history:  Jillian Sanders is a 63 year old woman with history of NASH with resultant cirrhosis, splenomegaly and chronic pancytopenia. She has chronic iron deficiency anemia not responsive to oral iron. She had a recent trial of full dose oral iron which was unsuccessful. Parenteral iron infusions were resumed. She currently receives iron infusions every 2 months.  She states she is "dragging and freezing". She denies any bleeding. Specifically no hematochezia or melena. She denies nausea/vomiting. No shortness of breath or chest pain. She was recently started on spironolactone by her liver physician at [facility].   Objective: Filed Vitals:   04/20/13 1345  BP: 127/56  Pulse: 66  Temp: 98 F (36.7 C)  Resp: 19   Oropharynx is without thrush or ulceration. No palpable cervical or supraclavicular lymph nodes. Lungs are clear. No wheezes or rales. Regular cardiac rhythm. Abdomen is soft, distended. Liver and spleen palpable. Bilateral lower leg edema right slightly greater than left. Calves nontender. Spider telangiectasias scattered over the chest and upper abdomen.   Lab Results: Lab Results  Component Value Date   WBC 2.8* 04/20/2013   HGB 10.3* 04/20/2013   HCT 31.2* 04/20/2013   MCV 94.1 04/20/2013   PLT 88* 04/20/2013   NEUTROABS 1.7 04/20/2013    Chemistry:    Chemistry      Component Value Date/Time   NA 137 04/20/2013 1323   NA 141 08/10/2012 1345   K 4.0 04/20/2013 1323   K 3.8 08/10/2012 1345   CL 109 08/10/2012 1345   CL 108* 05/19/2012 1326   CO2 22 04/20/2013 1323   BUN 9.0 04/20/2013 1323   BUN 10 08/10/2012 1345   CREATININE 0.7 04/20/2013 1323   CREATININE 0.70 08/10/2012 1345      Component Value Date/Time   CALCIUM 8.4 04/20/2013 1323   ALKPHOS 211* 04/20/2013 1323   AST 43* 04/20/2013 1323   ALT 29 04/20/2013 1323   BILITOT 1.70* 04/20/2013 1323       Studies/Results: No results found.  Medications: I have reviewed the patient's current  medications.  Assessment/Plan: 1. NASH. 2. Cirrhosis secondary to #1. 3. Splenomegaly secondary to #1. 4. Chronic pancytopenia secondary to #2 and #3. 5. 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  Dispositon-plan to continue every other month iron infusions. She is scheduled for an iron infusion today.  She was informed at today's visit that Dr. Beryle Beams is leaving the practice. Her care will be reassigned to Dr. Benay Spice.  She will return for a followup visit and iron infusion in 2 months. She will contact the office in the interim with any problems.   Plan reviewed with Dr. Beryle Beams.   Ned Card ANP/GNP-BC

## 2013-04-20 NOTE — Patient Instructions (Signed)

## 2013-04-21 ENCOUNTER — Telehealth: Payer: Self-pay | Admitting: Oncology

## 2013-04-21 NOTE — Telephone Encounter (Signed)
Gave pt appt for lab , ML and IV infusion for March and May 2015

## 2013-04-30 ENCOUNTER — Encounter: Payer: Self-pay | Admitting: Oncology

## 2013-04-30 NOTE — Progress Notes (Signed)
Faxed clinical information to [employer] @ [phone] °

## 2013-06-10 ENCOUNTER — Other Ambulatory Visit: Payer: Self-pay

## 2013-06-10 DIAGNOSIS — Z1231 Encounter for screening mammogram for malignant neoplasm of breast: Secondary | ICD-10-CM

## 2013-06-15 ENCOUNTER — Other Ambulatory Visit (HOSPITAL_BASED_OUTPATIENT_CLINIC_OR_DEPARTMENT_OTHER): Payer: Managed Care, Other (non HMO)

## 2013-06-15 ENCOUNTER — Ambulatory Visit (HOSPITAL_BASED_OUTPATIENT_CLINIC_OR_DEPARTMENT_OTHER): Payer: Managed Care, Other (non HMO) | Admitting: Nurse Practitioner

## 2013-06-15 ENCOUNTER — Ambulatory Visit (HOSPITAL_BASED_OUTPATIENT_CLINIC_OR_DEPARTMENT_OTHER): Payer: Managed Care, Other (non HMO)

## 2013-06-15 ENCOUNTER — Telehealth: Payer: Self-pay | Admitting: Oncology

## 2013-06-15 VITALS — BP 116/64 | HR 70 | Temp 97.7°F | Resp 20 | Ht 65.0 in | Wt 183.6 lb

## 2013-06-15 DIAGNOSIS — D509 Iron deficiency anemia, unspecified: Secondary | ICD-10-CM

## 2013-06-15 DIAGNOSIS — K7581 Nonalcoholic steatohepatitis (NASH): Secondary | ICD-10-CM

## 2013-06-15 DIAGNOSIS — D518 Other vitamin B12 deficiency anemias: Secondary | ICD-10-CM

## 2013-06-15 DIAGNOSIS — D539 Nutritional anemia, unspecified: Secondary | ICD-10-CM

## 2013-06-15 DIAGNOSIS — K7689 Other specified diseases of liver: Secondary | ICD-10-CM

## 2013-06-15 DIAGNOSIS — I8510 Secondary esophageal varices without bleeding: Secondary | ICD-10-CM

## 2013-06-15 DIAGNOSIS — D61818 Other pancytopenia: Secondary | ICD-10-CM

## 2013-06-15 DIAGNOSIS — K7460 Unspecified cirrhosis of liver: Secondary | ICD-10-CM

## 2013-06-15 DIAGNOSIS — R161 Splenomegaly, not elsewhere classified: Secondary | ICD-10-CM

## 2013-06-15 LAB — CBC WITH DIFFERENTIAL/PLATELET
BASO%: 0.9 % (ref 0.0–2.0)
Basophils Absolute: 0 10*3/uL (ref 0.0–0.1)
EOS%: 4.2 % (ref 0.0–7.0)
Eosinophils Absolute: 0.1 10*3/uL (ref 0.0–0.5)
HCT: 33.7 % — ABNORMAL LOW (ref 34.8–46.6)
HGB: 11.4 g/dL — ABNORMAL LOW (ref 11.6–15.9)
LYMPH%: 20.9 % (ref 14.0–49.7)
MCH: 31.2 pg (ref 25.1–34.0)
MCHC: 33.7 g/dL (ref 31.5–36.0)
MCV: 92.5 fL (ref 79.5–101.0)
MONO#: 0.2 10*3/uL (ref 0.1–0.9)
MONO%: 7.4 % (ref 0.0–14.0)
NEUT#: 2.1 10*3/uL (ref 1.5–6.5)
NEUT%: 66.6 % (ref 38.4–76.8)
Platelets: 91 10*3/uL — ABNORMAL LOW (ref 145–400)
RBC: 3.65 10*6/uL — AB (ref 3.70–5.45)
RDW: 14 % (ref 11.2–14.5)
WBC: 3.1 10*3/uL — ABNORMAL LOW (ref 3.9–10.3)
lymph#: 0.7 10*3/uL — ABNORMAL LOW (ref 0.9–3.3)

## 2013-06-15 LAB — FERRITIN CHCC: Ferritin: 128 ng/ml (ref 9–269)

## 2013-06-15 MED ORDER — SODIUM CHLORIDE 0.9 % IV SOLN
1020.0000 mg | Freq: Once | INTRAVENOUS | Status: AC
  Administered 2013-06-15: 1020 mg via INTRAVENOUS
  Filled 2013-06-15: qty 34

## 2013-06-15 NOTE — Patient Instructions (Signed)

## 2013-06-15 NOTE — Progress Notes (Signed)
OFFICE PROGRESS NOTE  Interval history:   Ms. Sommers is a 63 year old woman with history of NASH with resultant cirrhosis, splenomegaly and chronic pancytopenia. She has chronic iron deficiency anemia not responsive to oral iron (iron malabsorption anemia versus chronic GI blood loss from AVMs). She had a recent trial of full dose oral iron which was unsuccessful. Parenteral iron infusions were resumed. She currently receives iron infusions every 2 months.  She is seen today for scheduled followup. She notes improvement in her energy level for about 4-6 weeks after each iron infusion. She is now feeling weak. She denies no new bleeding. No bloody or black stools. Abdominal distention and leg swelling have improved since beginning spironolactone.   Objective: Filed Vitals:   06/15/13 1220  BP: 116/64  Pulse: 70  Temp: 97.7 F (36.5 C)  Resp: 20   Oropharynx is without thrush or ulceration. No palpable cervical or supraclavicular lymph nodes. Lungs are clear. No wheezes or rales. Regular cardiac rhythm. Abdomen is distended, soft. Liver and spleen palpable. No leg edema. Calves nontender. Spider telangiectasias scattered over the chest and upper abdomen.   Lab Results: Lab Results  Component Value Date   WBC 3.1* 06/15/2013   HGB 11.4* 06/15/2013   HCT 33.7* 06/15/2013   MCV 92.5 06/15/2013   PLT 91* 06/15/2013   NEUTROABS 2.1 06/15/2013    Chemistry:    Chemistry      Component Value Date/Time   NA 137 04/20/2013 1323   NA 141 08/10/2012 1345   K 4.0 04/20/2013 1323   K 3.8 08/10/2012 1345   CL 109 08/10/2012 1345   CL 108* 05/19/2012 1326   CO2 22 04/20/2013 1323   BUN 9.0 04/20/2013 1323   BUN 10 08/10/2012 1345   CREATININE 0.7 04/20/2013 1323   CREATININE 0.70 08/10/2012 1345      Component Value Date/Time   CALCIUM 8.4 04/20/2013 1323   ALKPHOS 211* 04/20/2013 1323   AST 43* 04/20/2013 1323   ALT 29 04/20/2013 1323   BILITOT 1.70* 04/20/2013 1323       Studies/Results: No  results found.  Medications: I have reviewed the patient's current medications.  Assessment/Plan: 1. NASH. 2. Cirrhosis secondary to #1. 3. Splenomegaly secondary to #1. 4. Chronic pancytopenia secondary to #2 and #3. 5. 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  Dispositon-she appears stable. Plan to continue every other month iron infusions. She is scheduled for an iron infusion today. She will return for a followup visit and iron infusion in 2 months. She will contact the office in the interim with any problems.  Plan reviewed with Dr. Beryle Beams.   Ned Card ANP/GNP-BC

## 2013-06-15 NOTE — Telephone Encounter (Signed)
gv and printed aptp sched and avs for pt for May

## 2013-06-23 ENCOUNTER — Ambulatory Visit
Admission: RE | Admit: 2013-06-23 | Discharge: 2013-06-23 | Disposition: A | Discharge status: Home / Self Care | Payer: Managed Care, Other (non HMO) | Source: Ambulatory Visit

## 2013-06-23 DIAGNOSIS — Z1231 Encounter for screening mammogram for malignant neoplasm of breast: Secondary | ICD-10-CM

## 2013-07-23 ENCOUNTER — Other Ambulatory Visit (HOSPITAL_COMMUNITY): Payer: Self-pay | Admitting: Internal Medicine

## 2013-07-23 DIAGNOSIS — R109 Unspecified abdominal pain: Secondary | ICD-10-CM

## 2013-07-23 DIAGNOSIS — R5381 Other malaise: Secondary | ICD-10-CM

## 2013-07-23 DIAGNOSIS — K7460 Unspecified cirrhosis of liver: Secondary | ICD-10-CM

## 2013-07-23 DIAGNOSIS — R5383 Other fatigue: Secondary | ICD-10-CM

## 2013-07-23 DIAGNOSIS — R188 Other ascites: Secondary | ICD-10-CM

## 2013-07-28 ENCOUNTER — Ambulatory Visit (INDEPENDENT_AMBULATORY_CARE_PROVIDER_SITE_OTHER): Payer: Managed Care, Other (non HMO) | Admitting: Ophthalmology

## 2013-07-28 ENCOUNTER — Ambulatory Visit (HOSPITAL_COMMUNITY): Payer: Managed Care, Other (non HMO)

## 2013-08-02 ENCOUNTER — Ambulatory Visit (HOSPITAL_COMMUNITY)
Admission: RE | Admit: 2013-08-02 | Discharge: 2013-08-02 | Disposition: A | Discharge status: Home / Self Care | Payer: Managed Care, Other (non HMO) | Source: Ambulatory Visit | Attending: Internal Medicine | Admitting: Internal Medicine

## 2013-08-02 DIAGNOSIS — K7460 Unspecified cirrhosis of liver: Secondary | ICD-10-CM

## 2013-08-02 DIAGNOSIS — R109 Unspecified abdominal pain: Secondary | ICD-10-CM

## 2013-08-02 DIAGNOSIS — R188 Other ascites: Secondary | ICD-10-CM | POA: Insufficient documentation

## 2013-08-02 DIAGNOSIS — R5381 Other malaise: Secondary | ICD-10-CM

## 2013-08-02 DIAGNOSIS — R5383 Other fatigue: Secondary | ICD-10-CM

## 2013-08-02 LAB — ALBUMIN, FLUID (OTHER): Albumin, Fluid: 1.1 g/dL

## 2013-08-02 LAB — BODY FLUID CELL COUNT WITH DIFFERENTIAL
Eos, Fluid: 0 %
Lymphs, Fluid: 31 %
MONOCYTE-MACROPHAGE-SEROUS FLUID: 55 % (ref 50–90)
Neutrophil Count, Fluid: 14 % (ref 0–25)
Total Nucleated Cell Count, Fluid: 607 cu mm (ref 0–1000)

## 2013-08-02 LAB — PROTEIN, BODY FLUID: Total protein, fluid: 2 g/dL

## 2013-08-02 NOTE — Procedures (Signed)
Successful US guided paracentesis from RLQ.  Yielded 3.2 liters of clear yellow fluid.  No immediate complications.  Pt tolerated well.   Specimen was sent for labs.  Ascencion Dike PA-C 08/02/2013 11:50 AM

## 2013-08-04 LAB — PATHOLOGIST SMEAR REVIEW

## 2013-08-05 LAB — BODY FLUID CULTURE: CULTURE: NO GROWTH

## 2013-08-10 ENCOUNTER — Ambulatory Visit (HOSPITAL_BASED_OUTPATIENT_CLINIC_OR_DEPARTMENT_OTHER): Payer: Managed Care, Other (non HMO)

## 2013-08-10 ENCOUNTER — Ambulatory Visit (HOSPITAL_BASED_OUTPATIENT_CLINIC_OR_DEPARTMENT_OTHER): Payer: Managed Care, Other (non HMO) | Admitting: Oncology

## 2013-08-10 ENCOUNTER — Other Ambulatory Visit (HOSPITAL_BASED_OUTPATIENT_CLINIC_OR_DEPARTMENT_OTHER): Payer: Managed Care, Other (non HMO)

## 2013-08-10 ENCOUNTER — Telehealth: Payer: Self-pay | Admitting: Oncology

## 2013-08-10 ENCOUNTER — Other Ambulatory Visit: Payer: Managed Care, Other (non HMO)

## 2013-08-10 VITALS — BP 125/52 | HR 66 | Temp 98.2°F | Resp 18 | Ht 65.0 in | Wt 178.6 lb

## 2013-08-10 DIAGNOSIS — R161 Splenomegaly, not elsewhere classified: Secondary | ICD-10-CM

## 2013-08-10 DIAGNOSIS — D509 Iron deficiency anemia, unspecified: Secondary | ICD-10-CM

## 2013-08-10 DIAGNOSIS — D539 Nutritional anemia, unspecified: Secondary | ICD-10-CM

## 2013-08-10 DIAGNOSIS — K7460 Unspecified cirrhosis of liver: Secondary | ICD-10-CM

## 2013-08-10 DIAGNOSIS — K909 Intestinal malabsorption, unspecified: Secondary | ICD-10-CM

## 2013-08-10 LAB — FERRITIN CHCC: Ferritin: 170 ng/ml (ref 9–269)

## 2013-08-10 LAB — CBC WITH DIFFERENTIAL/PLATELET
BASO%: 0.8 % (ref 0.0–2.0)
BASOS ABS: 0 10*3/uL (ref 0.0–0.1)
EOS%: 3.4 % (ref 0.0–7.0)
Eosinophils Absolute: 0.1 10*3/uL (ref 0.0–0.5)
HEMATOCRIT: 31.7 % — AB (ref 34.8–46.6)
HGB: 10.5 g/dL — ABNORMAL LOW (ref 11.6–15.9)
LYMPH%: 21 % (ref 14.0–49.7)
MCH: 31.6 pg (ref 25.1–34.0)
MCHC: 33.3 g/dL (ref 31.5–36.0)
MCV: 94.9 fL (ref 79.5–101.0)
MONO#: 0.2 10*3/uL (ref 0.1–0.9)
MONO%: 6.5 % (ref 0.0–14.0)
NEUT#: 2.1 10*3/uL (ref 1.5–6.5)
NEUT%: 68.3 % (ref 38.4–76.8)
Platelets: 88 10*3/uL — ABNORMAL LOW (ref 145–400)
RBC: 3.34 10*6/uL — ABNORMAL LOW (ref 3.70–5.45)
RDW: 14 % (ref 11.2–14.5)
WBC: 3 10*3/uL — ABNORMAL LOW (ref 3.9–10.3)
lymph#: 0.6 10*3/uL — ABNORMAL LOW (ref 0.9–3.3)

## 2013-08-10 MED ORDER — SODIUM CHLORIDE 0.9 % IV SOLN
1020.0000 mg | Freq: Once | INTRAVENOUS | Status: AC
  Administered 2013-08-10: 1020 mg via INTRAVENOUS
  Filled 2013-08-10: qty 34

## 2013-08-10 MED ORDER — SODIUM CHLORIDE 0.9 % IV SOLN
INTRAVENOUS | Status: DC
  Administered 2013-08-10: 14:00:00 via INTRAVENOUS

## 2013-08-10 NOTE — Telephone Encounter (Signed)
gv adn printed appt sched and avs for pt for July and SEpt

## 2013-08-10 NOTE — Progress Notes (Signed)
  [facility] OFFICE PROGRESS NOTE   Diagnosis: Iron deficiency anemia, cirrhosis  INTERVAL HISTORY:   She is followed by Dr. Beryle Beams with a history of iron deficiency anemia, pancytopenia, and cirrhosis. She receives IV iron every 2 months, last on 06/15/2013.  She recently developed increased abdominal distention and underwent a paracentesis 08/02/2013. The peritoneal fluid culture returned negative and no atypia was seen on pathology review. 3.2 L of fluid were removed.  She reports intermittent low abdomen and left "groin" pain. She wonders whether she has an abdominal hernia.  She reports tolerating the IV iron well. No difficulty with IV access.  Objective:  Vital signs in last 24 hours:  Blood pressure 125/52, pulse 66, temperature 98.2 F (36.8 C), temperature source Oral, resp. rate 18, height 5\' 5"  (1.651 m), weight 178 lb 9.6 oz (81.012 kg).    HEENT: Neck without mass Lymphatics: No cervical, supraclavicular, or inguinal nodes. Prominent axillary fat pads versus 1/2-1 cm soft mobile nodes-right greater than left. Resp: Lungs clear bilaterally Cardio: Regular rate and rhythm, 2/6 systolic murmur GI: The abdomen is distended, the spleen tip is palpable a few fingers below the left costal margin, small reducible ventral hernia Vascular: No leg edema  Skin: Telengectasias at the upper chest  Musculoskeletal: No pain with motion at the left or right hip.    Lab Results:  Lab Results  Component Value Date   WBC 3.0* 08/10/2013   HGB 10.5* 08/10/2013   HCT 31.7* 08/10/2013   MCV 94.9 08/10/2013   PLT 88* 08/10/2013   NEUTROABS 2.1 08/10/2013     Ferritin-170  Medications: I have reviewed the patient's current medications.  Assessment/Plan: 1. NASH. 2. Cirrhosis secondary to #1. 3. Splenomegaly secondary to #1. 4. Chronic pancytopenia secondary to #2 and #3. 5. 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 She is  maintained on every 2 month IV iron. 6. ? Abdominal hernia-she reports an abdominal MRI is scheduled at [facility] within the next few months 7. ? Small right axillary lymph node   Disposition:  Ms. Slappey is stable from a hematologic standpoint. The plan is to continue every 2 month IV iron. She will be scheduled for an office visit in 4 months.  Ladell Pier, MD  08/10/2013  1:09 PM

## 2013-08-10 NOTE — Patient Instructions (Signed)

## 2013-10-05 ENCOUNTER — Ambulatory Visit (HOSPITAL_BASED_OUTPATIENT_CLINIC_OR_DEPARTMENT_OTHER): Payer: Managed Care, Other (non HMO)

## 2013-10-05 ENCOUNTER — Other Ambulatory Visit (HOSPITAL_BASED_OUTPATIENT_CLINIC_OR_DEPARTMENT_OTHER): Payer: Managed Care, Other (non HMO)

## 2013-10-05 VITALS — BP 103/58 | HR 60 | Temp 97.1°F | Resp 18

## 2013-10-05 DIAGNOSIS — D539 Nutritional anemia, unspecified: Secondary | ICD-10-CM

## 2013-10-05 DIAGNOSIS — K909 Intestinal malabsorption, unspecified: Secondary | ICD-10-CM

## 2013-10-05 DIAGNOSIS — D509 Iron deficiency anemia, unspecified: Secondary | ICD-10-CM

## 2013-10-05 LAB — CBC WITH DIFFERENTIAL/PLATELET
BASO%: 0.9 % (ref 0.0–2.0)
Basophils Absolute: 0 10*3/uL (ref 0.0–0.1)
EOS%: 4.7 % (ref 0.0–7.0)
Eosinophils Absolute: 0.2 10*3/uL (ref 0.0–0.5)
HCT: 28.8 % — ABNORMAL LOW (ref 34.8–46.6)
HGB: 9.5 g/dL — ABNORMAL LOW (ref 11.6–15.9)
LYMPH%: 21.6 % (ref 14.0–49.7)
MCH: 31.8 pg (ref 25.1–34.0)
MCHC: 32.9 g/dL (ref 31.5–36.0)
MCV: 96.8 fL (ref 79.5–101.0)
MONO#: 0.3 10*3/uL (ref 0.1–0.9)
MONO%: 7.4 % (ref 0.0–14.0)
NEUT#: 2.3 10*3/uL (ref 1.5–6.5)
NEUT%: 65.4 % (ref 38.4–76.8)
PLATELETS: 93 10*3/uL — AB (ref 145–400)
RBC: 2.98 10*6/uL — ABNORMAL LOW (ref 3.70–5.45)
RDW: 14.4 % (ref 11.2–14.5)
WBC: 3.6 10*3/uL — ABNORMAL LOW (ref 3.9–10.3)
lymph#: 0.8 10*3/uL — ABNORMAL LOW (ref 0.9–3.3)

## 2013-10-05 LAB — FERRITIN CHCC: Ferritin: 191 ng/ml (ref 9–269)

## 2013-10-05 MED ORDER — FERUMOXYTOL INJECTION 510 MG/17 ML
1020.0000 mg | Freq: Once | INTRAVENOUS | Status: AC
  Administered 2013-10-05: 1020 mg via INTRAVENOUS
  Filled 2013-10-05: qty 34

## 2013-10-05 NOTE — Patient Instructions (Signed)

## 2013-10-05 NOTE — Progress Notes (Signed)
1410 Patient refused to stay full 30 minute post observation. Requested that patient stay till her B/P increased, pt agreeded. D/C ambulating. Patient verbalizes no complaints or issues at D/C. AVS provided.

## 2013-10-21 ENCOUNTER — Other Ambulatory Visit (HOSPITAL_COMMUNITY): Payer: Self-pay | Admitting: Internal Medicine

## 2013-10-21 DIAGNOSIS — I359 Nonrheumatic aortic valve disorder, unspecified: Secondary | ICD-10-CM

## 2013-10-27 ENCOUNTER — Ambulatory Visit (HOSPITAL_COMMUNITY)
Admission: RE | Admit: 2013-10-27 | Discharge: 2013-10-27 | Disposition: A | Discharge status: Home / Self Care | Payer: Managed Care, Other (non HMO) | Source: Ambulatory Visit | Attending: Cardiovascular Disease | Admitting: Cardiovascular Disease

## 2013-10-27 DIAGNOSIS — I359 Nonrheumatic aortic valve disorder, unspecified: Secondary | ICD-10-CM

## 2013-10-27 DIAGNOSIS — I079 Rheumatic tricuspid valve disease, unspecified: Secondary | ICD-10-CM | POA: Insufficient documentation

## 2013-10-27 NOTE — Progress Notes (Signed)
2D Echocardiogram Complete.  10/27/2013   Damek Ende [place], RDCS

## 2013-11-23 ENCOUNTER — Encounter: Payer: Self-pay | Admitting: Cardiology

## 2013-11-23 ENCOUNTER — Ambulatory Visit (INDEPENDENT_AMBULATORY_CARE_PROVIDER_SITE_OTHER): Payer: Managed Care, Other (non HMO) | Admitting: Cardiology

## 2013-11-23 VITALS — BP 110/60 | HR 66 | Ht 65.0 in | Wt 180.0 lb

## 2013-11-23 DIAGNOSIS — I10 Essential (primary) hypertension: Secondary | ICD-10-CM

## 2013-11-23 DIAGNOSIS — I359 Nonrheumatic aortic valve disorder, unspecified: Secondary | ICD-10-CM

## 2013-11-23 DIAGNOSIS — I350 Nonrheumatic aortic (valve) stenosis: Secondary | ICD-10-CM | POA: Insufficient documentation

## 2013-11-23 DIAGNOSIS — E785 Hyperlipidemia, unspecified: Secondary | ICD-10-CM

## 2013-11-23 NOTE — Patient Instructions (Signed)
Your physician wants you to follow-up in: 1 year You will receive a reminder letter in the mail two months in advance. If you don't receive a letter, please call our office to schedule the follow-up appointment.   Your physician recommends that you continue on your current medications as directed. Please refer to the Current Medication list given to you today.      Your physician has requested that you have an echocardiogram JUST BEFORE VISIT NEXT YEAR. Echocardiography is a painless test that uses sound waves to create images of your heart. It provides your doctor with information about the size and shape of your heart and how well your heart's chambers and valves are working. This procedure takes approximately one hour. There are no restrictions for this procedure.    Thank you for choosing [facility] !

## 2013-11-23 NOTE — Assessment & Plan Note (Signed)
Blood pressure is normal today. She is on Diovan and Aldactone.

## 2013-11-23 NOTE — Assessment & Plan Note (Signed)
Moderate by recent echocardiogram. She reports a long-standing history of heart murmur. Valve described as calcified and trileaflet by most recent study. She is not clearly symptomatic related to her aortic stenosis at this time, and plan will be followup in one year with an echocardiogram for reassessment, sooner if needed. She does not require antibiotic prophylaxis for SBE at this point.

## 2013-11-23 NOTE — Progress Notes (Signed)
Clinical Summary Ms. Chawla is a 63 y.o.female referred for cardiology consultation by Dr. Delfina Redwood. She reports a long-standing history of cardiac murmur, echocardiogram at least a few years ago through the [facility] practice, results not available for review at this time. She had a recent visit with Dr. Delfina Redwood over the summer, and a followup echocardiogram was obtained.  Echocardiogram done in July reported vigorous LVEF of 65-70% with grade 1 diastolic dysfunction, calcific aortic stenosis of moderate degree with mean gradient 25 mm mercury and peak gradient of 51 mm mercury (trileaflet), mild mitral regurgitation, mild left atrial enlargement, mild tricuspid r regurgitation.  Today we discussed the results of her testing, natural history of aortic stenosis, potential symptomatology, and general plan for followup at this time. She does not endorse any exertional angina, has occasional sense of palpitations but no syncope, reports NYHA class II dyspnea. ECG shows sinus rhythm with borderline increased voltage.  Lab work from July showed hemoglobin 9.5 with MCV 96, WBC 3.6, platelets 93. Lab work from January showed potassium 4.0, BUN 9, creatinine 0.7, AST 43, ALT 29.  Allergies  Allergen Reactions  . Valium [Diazepam]     causes hyperactivity    Current Outpatient Prescriptions  Medication Sig Dispense Refill  . alendronate (FOSAMAX) 70 MG tablet Take 70 mg by mouth every 7 (seven) days. Take with a full glass of water on an empty stomach.      . insulin aspart (NOVOLOG) 100 UNIT/ML injection Inject 5-30 Units into the skin 3 (three) times daily before meals. 150-200=5units 200-250=10units 250-300=15units 300-350=20units 350-400=25units 400-450=30units      . insulin glargine (LANTUS) 100 UNIT/ML injection Inject 40 Units into the skin 2 (two) times daily.       . Lactulose SOLN Take 30 mLs by mouth daily.       . nadolol (CORGARD) 40 MG tablet Take 40 mg by mouth every evening.      .  pantoprazole (PROTONIX) 40 MG tablet Take 40 mg by mouth daily.      . promethazine (PHENERGAN) 25 MG tablet Take 25 mg by mouth every 6 (six) hours as needed. Nausea      . rosuvastatin (CRESTOR) 10 MG tablet Take 10 mg by mouth every evening.       Marland Kitchen spironolactone (ALDACTONE) 25 MG tablet Take 25 mg by mouth daily.      . valsartan (DIOVAN) 320 MG tablet Take 320 mg by mouth daily with breakfast.       . venlafaxine XR (EFFEXOR-XR) 75 MG 24 hr capsule Take 75 mg by mouth daily with breakfast.        No current facility-administered medications for this visit.    Past Medical History  Diagnosis Date  . Type 2 diabetes mellitus   . Depression   . Aortic stenosis   . Headache(784.0)   . Arthritis   . Iron deficiency anemia   . NASH (nonalcoholic steatohepatitis) 2010    Familial - mother and sister  . Essential hypertension, benign   . Hypercholesteremia   . Diverticular disease     Sigmoid colectomy  . Portal hypertension with esophageal varices   . Pancytopenia     Past Surgical History  Procedure Laterality Date  . Hernia repair  2006  . Sigmoidectomy  2005  . Nasal septum surgery  1998  . Tonsillectomy and adenoidectomy  1992  . Abdominal hysterectomy  1993  . Cholecystectomy    . Colonoscopy  2013  Polyp removal    Family History  Problem Relation Age of Onset  . Diabetes Mother   . Hyperlipidemia Mother   . Cirrhosis Mother   . Cancer Father   . Heart disease Father     Social History Ms. Godbolt reports that she quit smoking about 35 years ago. Her smoking use included Cigarettes. She has a 10 pack-year smoking history. She has never used smokeless tobacco. Ms. Montilla reports that she does not drink alcohol.  Review of Systems Stable appetite. Chronic anemia requiring monthly iron infusions. She follows at [facility] by a hepatologist. No hematemesis or abdominal pains. Has had some trouble with lightheadedness after starting Aldactone. She is also on  high-dose Diovan. Other systems reviewed and negative except as outlined.  Physical Examination Filed Vitals:   11/23/13 0830  BP: 110/60  Pulse: 66   Filed Weights   11/23/13 0830  Weight: 180 lb (81.647 kg)   Overweight woman, appears comfortable at rest. HEENT: Conjunctiva and lids normal, oropharynx clear. Neck: Supple, no elevated JVP with probable radiation of cardiac murmur to both carotids (left greater than right), no thyromegaly. Lungs: Clear to auscultation, nonlabored breathing at rest. Cardiac: Regular rate and rhythm, no S3, 3/6 harsh systolic murmur at base, no pericardial rub. Abdomen: Soft, nontender, bowel sounds present. Extremities: No pitting edema, distal pulses 2+. Skin: Warm and dry. Musculoskeletal: No kyphosis. Neuropsychiatric: Alert and oriented x3, affect grossly appropriate.   Problem List and Plan   Aortic stenosis Moderate by recent echocardiogram. She reports a long-standing history of heart murmur. Valve described as calcified and trileaflet by most recent study. She is not clearly symptomatic related to her aortic stenosis at this time, and plan will be followup in one year with an echocardiogram for reassessment, sooner if needed. She does not require antibiotic prophylaxis for SBE at this point.  Essential hypertension, benign Blood pressure is normal today. She is on Diovan and Aldactone.  Hyperlipidemia On Crestor, followed by Dr. Delfina Redwood.    Satira Sark, M.D., F.A.C.C.

## 2013-11-23 NOTE — Assessment & Plan Note (Signed)
On Crestor, followed by Dr. Delfina Redwood.

## 2013-11-29 ENCOUNTER — Ambulatory Visit (INDEPENDENT_AMBULATORY_CARE_PROVIDER_SITE_OTHER): Payer: Managed Care, Other (non HMO) | Admitting: Ophthalmology

## 2013-11-29 DIAGNOSIS — H3530 Unspecified macular degeneration: Secondary | ICD-10-CM

## 2013-11-29 DIAGNOSIS — E1165 Type 2 diabetes mellitus with hyperglycemia: Secondary | ICD-10-CM

## 2013-11-29 DIAGNOSIS — E11319 Type 2 diabetes mellitus with unspecified diabetic retinopathy without macular edema: Secondary | ICD-10-CM

## 2013-11-29 DIAGNOSIS — H43819 Vitreous degeneration, unspecified eye: Secondary | ICD-10-CM

## 2013-11-29 DIAGNOSIS — H35039 Hypertensive retinopathy, unspecified eye: Secondary | ICD-10-CM

## 2013-11-29 DIAGNOSIS — H2510 Age-related nuclear cataract, unspecified eye: Secondary | ICD-10-CM

## 2013-11-29 DIAGNOSIS — E1139 Type 2 diabetes mellitus with other diabetic ophthalmic complication: Secondary | ICD-10-CM

## 2013-11-29 DIAGNOSIS — I10 Essential (primary) hypertension: Secondary | ICD-10-CM

## 2013-11-30 ENCOUNTER — Ambulatory Visit (HOSPITAL_BASED_OUTPATIENT_CLINIC_OR_DEPARTMENT_OTHER): Payer: Managed Care, Other (non HMO)

## 2013-11-30 ENCOUNTER — Telehealth: Payer: Self-pay | Admitting: Oncology

## 2013-11-30 ENCOUNTER — Other Ambulatory Visit (HOSPITAL_BASED_OUTPATIENT_CLINIC_OR_DEPARTMENT_OTHER): Payer: Managed Care, Other (non HMO)

## 2013-11-30 ENCOUNTER — Ambulatory Visit (HOSPITAL_BASED_OUTPATIENT_CLINIC_OR_DEPARTMENT_OTHER): Payer: Managed Care, Other (non HMO) | Admitting: Nurse Practitioner

## 2013-11-30 VITALS — BP 121/52 | HR 66 | Temp 97.5°F | Resp 18 | Ht 65.0 in | Wt 178.6 lb

## 2013-11-30 DIAGNOSIS — D61818 Other pancytopenia: Secondary | ICD-10-CM

## 2013-11-30 DIAGNOSIS — D539 Nutritional anemia, unspecified: Secondary | ICD-10-CM

## 2013-11-30 DIAGNOSIS — K7460 Unspecified cirrhosis of liver: Secondary | ICD-10-CM

## 2013-11-30 DIAGNOSIS — K909 Intestinal malabsorption, unspecified: Secondary | ICD-10-CM

## 2013-11-30 DIAGNOSIS — D509 Iron deficiency anemia, unspecified: Secondary | ICD-10-CM

## 2013-11-30 DIAGNOSIS — Z23 Encounter for immunization: Secondary | ICD-10-CM

## 2013-11-30 DIAGNOSIS — K7689 Other specified diseases of liver: Secondary | ICD-10-CM

## 2013-11-30 LAB — CBC WITH DIFFERENTIAL/PLATELET
BASO%: 0.8 % (ref 0.0–2.0)
Basophils Absolute: 0 10*3/uL (ref 0.0–0.1)
EOS%: 3.7 % (ref 0.0–7.0)
Eosinophils Absolute: 0.1 10*3/uL (ref 0.0–0.5)
HEMATOCRIT: 28.1 % — AB (ref 34.8–46.6)
HGB: 9.5 g/dL — ABNORMAL LOW (ref 11.6–15.9)
LYMPH%: 19.6 % (ref 14.0–49.7)
MCH: 32.5 pg (ref 25.1–34.0)
MCHC: 33.7 g/dL (ref 31.5–36.0)
MCV: 96.6 fL (ref 79.5–101.0)
MONO#: 0.2 10*3/uL (ref 0.1–0.9)
MONO%: 7.5 % (ref 0.0–14.0)
NEUT#: 1.9 10*3/uL (ref 1.5–6.5)
NEUT%: 68.4 % (ref 38.4–76.8)
Platelets: 86 10*3/uL — ABNORMAL LOW (ref 145–400)
RBC: 2.91 10*6/uL — ABNORMAL LOW (ref 3.70–5.45)
RDW: 13.5 % (ref 11.2–14.5)
WBC: 2.8 10*3/uL — ABNORMAL LOW (ref 3.9–10.3)
lymph#: 0.5 10*3/uL — ABNORMAL LOW (ref 0.9–3.3)

## 2013-11-30 LAB — FERRITIN CHCC: Ferritin: 221 ng/ml (ref 9–269)

## 2013-11-30 MED ORDER — SODIUM CHLORIDE 0.9 % IV SOLN
Freq: Once | INTRAVENOUS | Status: AC
  Administered 2013-11-30: 14:00:00 via INTRAVENOUS

## 2013-11-30 MED ORDER — SODIUM CHLORIDE 0.9 % IV SOLN
1020.0000 mg | Freq: Once | INTRAVENOUS | Status: AC
  Administered 2013-11-30: 1020 mg via INTRAVENOUS
  Filled 2013-11-30: qty 34

## 2013-11-30 MED ORDER — INFLUENZA VAC SPLIT QUAD 0.5 ML IM SUSY
0.5000 mL | PREFILLED_SYRINGE | Freq: Once | INTRAMUSCULAR | Status: AC
  Administered 2013-11-30: 0.5 mL via INTRAMUSCULAR
  Filled 2013-11-30: qty 0.5

## 2013-11-30 NOTE — Progress Notes (Signed)
  [facility] OFFICE PROGRESS NOTE   Diagnosis:  Iron deficiency anemia, cirrhosis  INTERVAL HISTORY:   Jillian Sanders returns as scheduled. She continues IV iron every 2 months. She denies any problems with the iron infusions. Main complaint is feeling tired. She denies bleeding. No interim illnesses or infections. She continues to note abdominal distention.  Objective:  Vital signs in last 24 hours:  Blood pressure 121/52, pulse 66, temperature 97.5 F (36.4 C), temperature source Oral, resp. rate 18, height 5\' 5"  (1.651 m), weight 178 lb 9.6 oz (81.012 kg), SpO2 100.00%.    HEENT: No thrush or ulcers. Lymphatics: No palpable cervical, supraclavicular or axillary lymph nodes. Resp: Lungs clear bilaterally. Cardio: Regular rate and rhythm. 2/6 systolic murmur. GI: Abdomen is distended. Spleen palpable 4 fingerbreadths below the left costal margin. Vascular: No leg edema.  Skin: Telangiectasias at the upper chest.    Lab Results:  Lab Results  Component Value Date   WBC 2.8* 11/30/2013   HGB 9.5* 11/30/2013   HCT 28.1* 11/30/2013   MCV 96.6 11/30/2013   PLT 86* 11/30/2013   NEUTROABS 1.9 11/30/2013   10/05/2013 ferritin 191. Imaging:  No results found.  Medications: I have reviewed the patient's current medications.  Assessment/Plan: 1. NASH.  2. Cirrhosis secondary to #1.  3. Splenomegaly secondary to #1.  4. Chronic pancytopenia secondary to #2 and #3. 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 She is maintained on every 2 month IV iron.  5. ? Abdominal hernia 08/10/2013. She reported an abdominal MRI was scheduled at [facility]  6. ? Small right axillary lymph node 08/10/2013. No adenopathy noted on exam 11/30/2013.   Disposition: She remains stable from a hematologic standpoint. Plan to continue IV iron every 2 months. She will return for a followup visit in 4 months.  Plan reviewed with Dr. Benay Spice.    Ned Card  ANP/GNP-BC   11/30/2013  1:03 PM

## 2013-11-30 NOTE — Patient Instructions (Signed)

## 2013-11-30 NOTE — Patient Instructions (Signed)
Influenza Vaccine (Flu Vaccine, Inactivated or Recombinant) 2014-2015: What You Need to Know 1. Why get vaccinated? Influenza ("flu") is a contagious disease that spreads around the [place] every winter, usually between October and May. Flu is caused by influenza viruses, and is spread mainly by coughing, sneezing, and close contact. Anyone can get flu, but the risk of getting flu is highest among children. Symptoms come on suddenly and may last several days. They can include:  fever/chills  sore throat  muscle aches  fatigue  cough  headache  runny or stuffy nose Flu can make some people much sicker than others. These people include young children, people 65 and older, pregnant women, and people with certain health conditions-such as heart, lung or kidney disease, nervous system disorders, or a weakened immune system. Flu vaccination is especially important for these people, and anyone in close contact with them. Flu can also lead to pneumonia, and make existing medical conditions worse. It can cause diarrhea and seizures in children. Each year thousands of people in the [place] die from flu, and many more are hospitalized. Flu vaccine is the best protection against flu and its complications. Flu vaccine also helps prevent spreading flu from person to person. 2. Inactivated and recombinant flu vaccines You are getting an injectable flu vaccine, which is either an "inactivated" or "recombinant" vaccine. These vaccines do not contain any live influenza virus. They are given by injection with a needle, and often called the "flu shot."  A different live, attenuated (weakened) influenza vaccine is sprayed into the nostrils. This vaccine is described in a separate Vaccine Information Statement. Flu vaccination is recommended every year. Some children 6 months through 8 years of age might need two doses during one year. Flu viruses are always changing. Each year's flu vaccine is made  to protect against 3 or 4 viruses that are likely to cause disease that year. Flu vaccine cannot prevent all cases of flu, but it is the best defense against the disease.  It takes about 2 weeks for protection to develop after the vaccination, and protection lasts several months to a year. Some illnesses that are not caused by influenza virus are often mistaken for flu. Flu vaccine will not prevent these illnesses. It can only prevent influenza. Some inactivated flu vaccine contains a very small amount of a mercury-based preservative called thimerosal. Studies have shown that thimerosal in vaccines is not harmful, but flu vaccines that do not contain a preservative are available. 3. Some people should not get this vaccine Tell the person who gives you the vaccine:  If you have any severe, life-threatening allergies. If you ever had a life-threatening allergic reaction after a dose of flu vaccine, or have a severe allergy to any part of this vaccine, including (for example) an allergy to gelatin, antibiotics, or eggs, you may be advised not to get vaccinated. Most, but not all, types of flu vaccine contain a small amount of egg protein.  If you ever had Guillain-Barr Syndrome (a severe paralyzing illness, also called GBS). Some people with a history of GBS should not get this vaccine. This should be discussed with your doctor.  If you are not feeling well. It is usually okay to get flu vaccine when you have a mild illness, but you might be advised to wait until you feel better. You should come back when you are better. 4. Risks of a vaccine reaction With a vaccine, like any medicine, there is a chance of side   effects. These are usually mild and go away on their own. Problems that could happen after any vaccine:  Brief fainting spells can happen after any medical procedure, including vaccination. Sitting or lying down for about 15 minutes can help prevent fainting, and injuries caused by a fall. Tell  your doctor if you feel dizzy, or have vision changes or ringing in the ears.  Severe shoulder pain and reduced range of motion in the arm where a shot was given can happen, very rarely, after a vaccination.  Severe allergic reactions from a vaccine are very rare, estimated at less than 1 in a million doses. If one were to occur, it would usually be within a few minutes to a few hours after the vaccination. Mild problems following inactivated flu vaccine:  soreness, redness, or swelling where the shot was given  hoarseness  sore, red or itchy eyes  cough  fever  aches  headache  itching  fatigue If these problems occur, they usually begin soon after the shot and last 1 or 2 days. Moderate problems following inactivated flu vaccine:  Young children who get inactivated flu vaccine and pneumococcal vaccine (PCV13) at the same time may be at increased risk for seizures caused by fever. Ask your doctor for more information. Tell your doctor if a child who is getting flu vaccine has ever had a seizure. Inactivated flu vaccine does not contain live flu virus, so you cannot get the flu from this vaccine. As with any medicine, there is a very remote chance of a vaccine causing a serious injury or death. The safety of vaccines is always being monitored. For more information, visit: www.cdc.gov/vaccinesafety/ 5. What if there is a serious reaction? What should I look for?  Look for anything that concerns you, such as signs of a severe allergic reaction, very high fever, or behavior changes. Signs of a severe allergic reaction can include hives, swelling of the face and throat, difficulty breathing, a fast heartbeat, dizziness, and weakness. These would start a few minutes to a few hours after the vaccination. What should I do?  If you think it is a severe allergic reaction or other emergency that can't wait, call 9-1-1 and get the person to the nearest hospital. Otherwise, call your  doctor.  Afterward, the reaction should be reported to the Vaccine Adverse Event Reporting System (VAERS). Your doctor should file this report, or you can do it yourself through the VAERS web site at www.vaers.hhs.gov, or by calling 1-800-822-7967. VAERS does not give medical advice. 6. The National Vaccine Injury Compensation Program The National Vaccine Injury Compensation Program (VICP) is a federal program that was created to compensate people who may have been injured by certain vaccines. Persons who believe they may have been injured by a vaccine can learn about the program and about filing a claim by calling 1-800-338-2382 or visiting the VICP website at www.hrsa.gov/vaccinecompensation. There is a time limit to file a claim for compensation. 7. How can I learn more?  Ask your health care provider.  Call your local or state health department.  Contact the Centers for Disease Control and Prevention (CDC):  Call 1-800-232-4636 (1-800-CDC-INFO) or  Visit CDC's website at www.cdc.gov/flu CDC Vaccine Information Statement (Interim) Inactivated Influenza Vaccine (11/17/2012) Document Released: 01/10/2006 Document Revised: 08/02/2013 Document Reviewed: 03/05/2013 ExitCare Patient Information 2015 [employer]. This information is not intended to replace advice given to you by your health care provider. Make sure you discuss any questions you have with your health   care provider.  

## 2013-11-30 NOTE — Telephone Encounter (Signed)
gv and printed appt sched and avs for pt for OCT and Dec....sed added tx.

## 2013-12-29 ENCOUNTER — Encounter: Payer: Self-pay | Admitting: Gynecologic Oncology

## 2013-12-29 ENCOUNTER — Ambulatory Visit: Payer: Managed Care, Other (non HMO) | Attending: Gynecologic Oncology | Admitting: Gynecologic Oncology

## 2013-12-29 ENCOUNTER — Telehealth: Payer: Self-pay | Admitting: Cardiology

## 2013-12-29 VITALS — BP 97/47 | HR 64 | Temp 98.4°F | Resp 18 | Ht 65.0 in | Wt 178.1 lb

## 2013-12-29 DIAGNOSIS — K7689 Other specified diseases of liver: Secondary | ICD-10-CM | POA: Insufficient documentation

## 2013-12-29 DIAGNOSIS — Z87891 Personal history of nicotine dependence: Secondary | ICD-10-CM | POA: Diagnosis not present

## 2013-12-29 DIAGNOSIS — Z90710 Acquired absence of both cervix and uterus: Secondary | ICD-10-CM | POA: Insufficient documentation

## 2013-12-29 DIAGNOSIS — I10 Essential (primary) hypertension: Secondary | ICD-10-CM | POA: Insufficient documentation

## 2013-12-29 DIAGNOSIS — N9489 Other specified conditions associated with female genital organs and menstrual cycle: Secondary | ICD-10-CM | POA: Diagnosis not present

## 2013-12-29 DIAGNOSIS — R971 Elevated cancer antigen 125 [CA 125]: Secondary | ICD-10-CM | POA: Diagnosis not present

## 2013-12-29 DIAGNOSIS — E119 Type 2 diabetes mellitus without complications: Secondary | ICD-10-CM | POA: Insufficient documentation

## 2013-12-29 DIAGNOSIS — N839 Noninflammatory disorder of ovary, fallopian tube and broad ligament, unspecified: Secondary | ICD-10-CM | POA: Diagnosis not present

## 2013-12-29 DIAGNOSIS — R1909 Other intra-abdominal and pelvic swelling, mass and lump: Secondary | ICD-10-CM | POA: Diagnosis present

## 2013-12-29 NOTE — Patient Instructions (Signed)
Plan to arrange appointments with your gastroenterologist and cardiologist.  We will contact you with a date and time for your possible upcoming surgery at [facility].  Please call for any questions or concerns.

## 2013-12-29 NOTE — Telephone Encounter (Signed)
Will forward to Dr. Domenic Polite. Pt says will have abdominal surgery in next 2-3 weeks and needs clearance letter sent to: Dr. Skeet Latch at [facility]. Told pt I would call after Dr. Domenic Polite reviewed

## 2013-12-29 NOTE — Telephone Encounter (Signed)
Patient needs surgical clearance for abdominal surgery for mass. Patient would like to speak with provider regarding concerns. / tgs

## 2013-12-29 NOTE — Progress Notes (Signed)
Consult Note: Gyn-Onc  Consult was requested by Dr. Nelda Marseille for the evaluation of Jillian Sanders 63 y.o. female  CC:  Chief Complaint  Patient presents with  . Pelvic Mass    New patient    Assessment/Plan:  [name]  is a 63 y.o.  with complex adnexal masses with blood flow adjacent to the ovaries. Given her history of hysterectomy and diet for further surgeries possible this was representative of scar tissue around the adnexa. She does report abdominal pain on the left side which can be quite intense and is not characterizes as having similarity to the pain she felt when she had diverticulitis. Her CA 125 is elevated but this may be as a result of liver cirrhosis.  Exploratory laparotomy bilateral salpingo-oophorectomy can only be entertained if GI evaluation deems that this patient is healthy enough for surgery. I am concerned given the report of esophageal cirrhosis ascites and physical findings of hemorrhoids. I've asked [name] to provide [place] with contact information for her gastroenterologist so that we can discuss what her options are.  In addition cardiology clearance will be necessary given the diagnosis of aortic stenosis and the quite loud murmur.  The plan will be for this procedure to be performed at [facility]. If [name] is not a surgical candidate the plan will be for paracentesis in an attempt to identify malignant cells if indeed they are there.  HPI: [name]  is a 63 y.o.   gravida 2 para 2 with the reports of intermittent abdominal pain for approximately 1-1/2 years. The pain is characterized as dull like a toothache and is localized to the left lower quadrant. It's markedly improved his use of ibuprofen. The pain can be as intense as 9/10 and is associated with some nausea. An office ultrasound 12/23/2013  is notable for complex cystic masses with thin septations seen in the midline pelvis adjacent to both ovaries measuring 6.7 x 5.3 x 4.8. Blood  flow is noted in these lesions. A comment is made that both ovaries appear within normal limits. CA 125 was obtained returned a value of 243.7.   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 She's been seen at [facility] and is being followed in the event that she'll need a transplant. She states that her MELD score has been stable at 11 for numerous years. Her LFTs are notable for an AST of 44 and alkaline phosphatase of 118 minutes 3.4 total bili of 1.5. Most recent INR was 1.2. A paracentesis was collected in may of 2015 no cytology was obtained.   History is notable for sigmoid colectomy for diverticular disease in 2005 .  Review of Systems:  Constitutional  Feels well,  Cardiovascular  No chest pain, shortness of breath, or edema  Pulmonary  No cough or wheeze. Denies hemoptysis Gastro Intestinal  No nausea, vomitting, or diarrhoea. No bright red blood per rectum, no abdominal pain, change in bowel movement, or constipation. No hematemesis Genito Urinary  No frequency, urgency, dysuria, no vaginal bleeding or discharge  Musculo Skeletal  No myalgia,no vaginal bleeding or discharge  arthralgia, joint swelling or pain  Neurologic  No weakness, numbness, change in gait,  Psychology  No depression, anxiety, insomnia.    Current Meds:  Outpatient Encounter Prescriptions as of 12/29/2013  Medication Sig  . alendronate (FOSAMAX) 70 MG tablet Take 70 mg by mouth every 7 (seven) days. Take with a full glass  of water on an empty stomach.  . insulin aspart (NOVOLOG) 100 UNIT/ML injection Inject 5-30 Units into the skin 3 (three) times daily before meals. 150-200=5units 200-250=10units 250-300=15units 300-350=20units 350-400=25units 400-450=30units  . insulin glargine (LANTUS) 100 UNIT/ML injection Inject 40 Units into the skin 2 (two) times daily.   . Lactulose SOLN Take 30 mLs by mouth daily.   . nadolol (CORGARD) 40 MG  tablet Take 40 mg by mouth every evening.  . pantoprazole (PROTONIX) 40 MG tablet Take 40 mg by mouth daily.  . promethazine (PHENERGAN) 25 MG tablet Take 25 mg by mouth every 6 (six) hours as needed. Nausea  . rosuvastatin (CRESTOR) 10 MG tablet Take 10 mg by mouth every evening.   Marland Kitchen spironolactone (ALDACTONE) 25 MG tablet Take 50 mg by mouth daily.   . valsartan (DIOVAN) 320 MG tablet Take 320 mg by mouth daily with breakfast.   . venlafaxine XR (EFFEXOR-XR) 75 MG 24 hr capsule Take 75 mg by mouth daily with breakfast.   . furosemide (LASIX) 40 MG tablet Take 40 mg by mouth daily.    Allergy:  Allergies  Allergen Reactions  . Valium [Diazepam]     causes hyperactivity    Social Hx:   History   Social History  . Marital Status: Married    Spouse Name: N/A    Number of Children: N/A  . Years of Education: N/A   Occupational History  . Not on file.   Social History Main Topics  . Smoking status: Former Smoker -- 1.00 packs/day for 10 years    Types: Cigarettes    Quit date: 12/09/1977  . Smokeless tobacco: Never Used  . Alcohol Use: No  . Drug Use: No  . Sexual Activity: Not on file   Other Topics Concern  . Not on file   Social History Narrative  . No narrative on file    Past Surgical Hx:  Past Surgical History  Procedure Laterality Date  . Hernia repair  2006  . Sigmoidectomy  2005  . Nasal septum surgery  1998  . Tonsillectomy and adenoidectomy  1992  . Abdominal hysterectomy  1993  . Cholecystectomy    . Colonoscopy  2013    Polyp removal    Past Medical Hx:  Past Medical History  Diagnosis Date  . Type 2 diabetes mellitus   . Depression   . Aortic stenosis   . Headache(784.0)   . Arthritis   . Iron deficiency anemia   . NASH (nonalcoholic steatohepatitis) 2010    Familial - mother and sister  . Essential hypertension, benign   . Hypercholesteremia   . Diverticular disease     Sigmoid colectomy  . Portal hypertension with esophageal varices    . Pancytopenia     Past Gynecological History:  Is 2 para 25 NSVDs vaginal hysterectomy in 2000 for heavy bleeding Menopause at the age of 64  no history of abnormal Pap test. Reports use of oral contraceptive pills for 4-5 years No LMP recorded. Patient has had a hysterectomy.  Family Hx:  Family History  Problem Relation Age of Onset  . Diabetes Mother   . Hyperlipidemia Mother   . Cirrhosis Mother   . Cancer Father   . Heart disease Father   . Lung cancer Father   . Lung cancer Sister   . Brain cancer Sister   . Breast cancer Maternal Aunt   . Breast cancer Maternal Aunt     Vitals:  Blood pressure 97/47,  pulse 64, temperature 98.4 F (36.9 C), temperature source Oral, resp. rate 18, height 5\' 5"  (1.651 m), weight 178 lb 1.6 oz (80.786 kg).  Physical Exam: WD in NAD no jaundice Neck  Supple NROM, without any enlargements.  Lymph Node Survey No cervical supraclavicular or inguinal adenopathy Cardiovascular  Pulse normal rate, regularity and rhythm. S1 and S2 normal. Blowing holosystolic murmur Lungs  Clear to auscultation bilaterally, Skin  No rash/lesions/breakdown no telangiectasias noted Psychiatry  Alert and oriented appropriate mood affect speech and reasoning. Abdomen  Normoactive bowel sounds, abdomen soft, non-tender. Surgical  sites intact without evidence of hernia. Unable to appreciate ascites Back No CVA tenderness Genito Urinary  Vulva/vagina: Normal external female genitalia.  No lesions. No discharge or bleeding.  Bladder/urethra:  No lesions or masses  Vagina: Atrophic no masses  Adnexa: No palpable masses. Rectal  Good tone, no masses no cul de sac nodularity.  external hemorrhoids noted  Extremities  No bilateral cyanosis, clubbing or edema.   CROSS, Genella Rife, MD, PhD 12/29/2013, 12:35 PM

## 2013-12-30 NOTE — Telephone Encounter (Signed)
I just saw Jillian Sanders in consultation in the office in late August for evaluation of aortic stenosis. She had a recent echocardiogram in July that shows LVEF 65-70% with moderate aortic stenosis, mean gradient 25 mm mercury. She is asymptomatic at the present time. I have recommended followup echocardiogram in one year, sooner if she develops any symptoms that would suggest progression. Based on her recent evaluation, I doubt that she needs further cardiac workup for this, and should be able to pursue exploratory laparotomy with bilateral salpingo-oophorectomy at overall moderate perioperative cardiac risk. I will forward this to Dr. Skeet Latch.  Satira Sark, M.D., F.A.C.C.

## 2013-12-31 ENCOUNTER — Telehealth: Payer: Self-pay | Admitting: *Deleted

## 2013-12-31 NOTE — Telephone Encounter (Signed)
Pt notified of Dr. Domenic Polite forwarding preop info to Dr. Skeet Latch

## 2013-12-31 NOTE — Telephone Encounter (Signed)
Called pt LMOVM regarding a follow up GI appt, if this has been made and if so name of MD as we will need to forward Dr. Robinette Haines note. Pt surgery is pending clearance  From GI and Cardiology. Requested pt call office with additional information.

## 2014-01-05 ENCOUNTER — Telehealth: Payer: Self-pay | Admitting: *Deleted

## 2014-01-05 NOTE — Telephone Encounter (Signed)
Call placed to [facility] Dr. Marcelene Butte office to assist pt in scheduling an appt. For f/u. Unable to reach staff. Message left to call our office.  Pt needs f/u appt with Dr. Wynetta Emery to evaluate and deem patient healthy enough for surgery.

## 2014-01-06 ENCOUNTER — Telehealth: Payer: Self-pay | Admitting: *Deleted

## 2014-01-06 NOTE — Telephone Encounter (Signed)
Placed call to Dr. Durenda Age office Katrina, RN, LMOVM for call back regarding pt f/u appt for surgical clearance.

## 2014-01-06 NOTE — Telephone Encounter (Signed)
Call from [place] at Dr. Durenda Age office. Pt Scheduled for an appt on Oct 22 at 4:15pm. Notified pt, verbalized and confirmed date and time. No further concerns.

## 2014-01-24 ENCOUNTER — Telehealth: Payer: Self-pay | Admitting: Gynecologic Oncology

## 2014-01-24 NOTE — Telephone Encounter (Signed)
Spoke with Dr. Glade Stanford office about patient needing clearance for surgery due to her cirrhosis and portal hypertension.  Stating that she could proceed with surgery but it would be risky for anyone with cirrhosis.  Her office is to send a letter via fax stating the above.  Surgery posted at [facility].

## 2014-01-25 ENCOUNTER — Ambulatory Visit: Payer: Managed Care, Other (non HMO)

## 2014-01-25 ENCOUNTER — Other Ambulatory Visit (HOSPITAL_BASED_OUTPATIENT_CLINIC_OR_DEPARTMENT_OTHER): Payer: Managed Care, Other (non HMO)

## 2014-01-25 DIAGNOSIS — D509 Iron deficiency anemia, unspecified: Secondary | ICD-10-CM

## 2014-01-25 LAB — CBC WITH DIFFERENTIAL/PLATELET
BASO%: 0.6 % (ref 0.0–2.0)
Basophils Absolute: 0 10*3/uL (ref 0.0–0.1)
EOS%: 4.1 % (ref 0.0–7.0)
Eosinophils Absolute: 0.1 10*3/uL (ref 0.0–0.5)
HCT: 27.6 % — ABNORMAL LOW (ref 34.8–46.6)
HGB: 9.3 g/dL — ABNORMAL LOW (ref 11.6–15.9)
LYMPH%: 22.9 % (ref 14.0–49.7)
MCH: 32.5 pg (ref 25.1–34.0)
MCHC: 33.7 g/dL (ref 31.5–36.0)
MCV: 96.5 fL (ref 79.5–101.0)
MONO#: 0.3 10*3/uL (ref 0.1–0.9)
MONO%: 7.9 % (ref 0.0–14.0)
NEUT%: 64.5 % (ref 38.4–76.8)
NEUTROS ABS: 2.2 10*3/uL (ref 1.5–6.5)
PLATELETS: 87 10*3/uL — AB (ref 145–400)
RBC: 2.86 10*6/uL — AB (ref 3.70–5.45)
RDW: 14.1 % (ref 11.2–14.5)
WBC: 3.4 10*3/uL — ABNORMAL LOW (ref 3.9–10.3)
lymph#: 0.8 10*3/uL — ABNORMAL LOW (ref 0.9–3.3)

## 2014-01-25 LAB — FERRITIN CHCC: FERRITIN: 242 ng/mL (ref 9–269)

## 2014-01-25 NOTE — Progress Notes (Signed)
Per Ned Card, NP patient does not need iron infusion today unless she feels like she needs it.  Pt reports that she feels fine and does not wish to have iron today, will return in December as scheduled.

## 2014-02-16 ENCOUNTER — Telehealth: Payer: Self-pay | Admitting: *Deleted

## 2014-02-16 NOTE — Telephone Encounter (Signed)
Pt called states " I had to cancel my surgery at [facility] on 2023-02-26 because we had a death in the family. I'm ready to schedule it for as soon as possible." Call placed to [facility] Pre-op appt pending Surgery date  Dec 2. Katharine Look at [facility] will contact pt regarding pre-op appt

## 2014-02-21 DIAGNOSIS — Z01818 Encounter for other preprocedural examination: Secondary | ICD-10-CM | POA: Diagnosis not present

## 2014-02-21 DIAGNOSIS — R1900 Intra-abdominal and pelvic swelling, mass and lump, unspecified site: Secondary | ICD-10-CM | POA: Diagnosis not present

## 2014-03-02 DIAGNOSIS — N8320 Unspecified ovarian cysts: Secondary | ICD-10-CM | POA: Diagnosis present

## 2014-03-02 DIAGNOSIS — I959 Hypotension, unspecified: Secondary | ICD-10-CM | POA: Diagnosis not present

## 2014-03-02 DIAGNOSIS — D1801 Hemangioma of skin and subcutaneous tissue: Secondary | ICD-10-CM | POA: Diagnosis present

## 2014-03-02 DIAGNOSIS — K652 Spontaneous bacterial peritonitis: Secondary | ICD-10-CM | POA: Diagnosis not present

## 2014-03-02 DIAGNOSIS — I083 Combined rheumatic disorders of mitral, aortic and tricuspid valves: Secondary | ICD-10-CM | POA: Diagnosis present

## 2014-03-02 DIAGNOSIS — I9581 Postprocedural hypotension: Secondary | ICD-10-CM | POA: Diagnosis not present

## 2014-03-02 DIAGNOSIS — K766 Portal hypertension: Secondary | ICD-10-CM | POA: Diagnosis present

## 2014-03-02 DIAGNOSIS — D61818 Other pancytopenia: Secondary | ICD-10-CM | POA: Diagnosis present

## 2014-03-02 DIAGNOSIS — K913 Postprocedural intestinal obstruction: Secondary | ICD-10-CM | POA: Diagnosis not present

## 2014-03-02 DIAGNOSIS — Z9049 Acquired absence of other specified parts of digestive tract: Secondary | ICD-10-CM | POA: Diagnosis present

## 2014-03-02 DIAGNOSIS — K760 Fatty (change of) liver, not elsewhere classified: Secondary | ICD-10-CM | POA: Diagnosis not present

## 2014-03-02 DIAGNOSIS — K7460 Unspecified cirrhosis of liver: Secondary | ICD-10-CM | POA: Diagnosis not present

## 2014-03-02 DIAGNOSIS — R1909 Other intra-abdominal and pelvic swelling, mass and lump: Secondary | ICD-10-CM | POA: Diagnosis not present

## 2014-03-02 DIAGNOSIS — J918 Pleural effusion in other conditions classified elsewhere: Secondary | ICD-10-CM | POA: Diagnosis not present

## 2014-03-02 DIAGNOSIS — E861 Hypovolemia: Secondary | ICD-10-CM | POA: Diagnosis not present

## 2014-03-02 DIAGNOSIS — M1990 Unspecified osteoarthritis, unspecified site: Secondary | ICD-10-CM | POA: Diagnosis present

## 2014-03-02 DIAGNOSIS — Z90710 Acquired absence of both cervix and uterus: Secondary | ICD-10-CM | POA: Diagnosis not present

## 2014-03-02 DIAGNOSIS — I361 Nonrheumatic tricuspid (valve) insufficiency: Secondary | ICD-10-CM | POA: Diagnosis not present

## 2014-03-02 DIAGNOSIS — R188 Other ascites: Secondary | ICD-10-CM | POA: Diagnosis not present

## 2014-03-02 DIAGNOSIS — S36499A Other injury of unspecified part of small intestine, initial encounter: Secondary | ICD-10-CM | POA: Diagnosis not present

## 2014-03-02 DIAGNOSIS — Z4682 Encounter for fitting and adjustment of non-vascular catheter: Secondary | ICD-10-CM | POA: Diagnosis not present

## 2014-03-02 DIAGNOSIS — I350 Nonrheumatic aortic (valve) stenosis: Secondary | ICD-10-CM | POA: Diagnosis not present

## 2014-03-02 DIAGNOSIS — R1900 Intra-abdominal and pelvic swelling, mass and lump, unspecified site: Secondary | ICD-10-CM | POA: Diagnosis not present

## 2014-03-02 DIAGNOSIS — K660 Peritoneal adhesions (postprocedural) (postinfection): Secondary | ICD-10-CM | POA: Diagnosis not present

## 2014-03-02 DIAGNOSIS — Z9889 Other specified postprocedural states: Secondary | ICD-10-CM | POA: Diagnosis not present

## 2014-03-02 DIAGNOSIS — I058 Other rheumatic mitral valve diseases: Secondary | ICD-10-CM | POA: Diagnosis not present

## 2014-03-02 DIAGNOSIS — Z794 Long term (current) use of insulin: Secondary | ICD-10-CM | POA: Diagnosis not present

## 2014-03-02 DIAGNOSIS — I10 Essential (primary) hypertension: Secondary | ICD-10-CM | POA: Diagnosis not present

## 2014-03-02 DIAGNOSIS — I340 Nonrheumatic mitral (valve) insufficiency: Secondary | ICD-10-CM | POA: Diagnosis not present

## 2014-03-02 DIAGNOSIS — K7581 Nonalcoholic steatohepatitis (NASH): Secondary | ICD-10-CM | POA: Diagnosis not present

## 2014-03-02 DIAGNOSIS — E119 Type 2 diabetes mellitus without complications: Secondary | ICD-10-CM | POA: Diagnosis not present

## 2014-03-08 ENCOUNTER — Other Ambulatory Visit: Payer: Self-pay | Admitting: Nurse Practitioner

## 2014-03-09 DIAGNOSIS — K7460 Unspecified cirrhosis of liver: Secondary | ICD-10-CM | POA: Diagnosis not present

## 2014-03-09 DIAGNOSIS — Z87891 Personal history of nicotine dependence: Secondary | ICD-10-CM | POA: Diagnosis not present

## 2014-03-09 DIAGNOSIS — I10 Essential (primary) hypertension: Secondary | ICD-10-CM | POA: Diagnosis present

## 2014-03-09 DIAGNOSIS — R188 Other ascites: Secondary | ICD-10-CM | POA: Diagnosis not present

## 2014-03-09 DIAGNOSIS — F329 Major depressive disorder, single episode, unspecified: Secondary | ICD-10-CM | POA: Diagnosis present

## 2014-03-09 DIAGNOSIS — I959 Hypotension, unspecified: Secondary | ICD-10-CM | POA: Diagnosis not present

## 2014-03-09 DIAGNOSIS — K5790 Diverticulosis of intestine, part unspecified, without perforation or abscess without bleeding: Secondary | ICD-10-CM | POA: Diagnosis present

## 2014-03-09 DIAGNOSIS — Z794 Long term (current) use of insulin: Secondary | ICD-10-CM | POA: Diagnosis not present

## 2014-03-09 DIAGNOSIS — I8500 Esophageal varices without bleeding: Secondary | ICD-10-CM | POA: Diagnosis present

## 2014-03-09 DIAGNOSIS — G9340 Encephalopathy, unspecified: Secondary | ICD-10-CM | POA: Diagnosis present

## 2014-03-09 DIAGNOSIS — K7581 Nonalcoholic steatohepatitis (NASH): Secondary | ICD-10-CM | POA: Diagnosis not present

## 2014-03-09 DIAGNOSIS — E780 Pure hypercholesterolemia: Secondary | ICD-10-CM | POA: Diagnosis present

## 2014-03-09 DIAGNOSIS — Z66 Do not resuscitate: Secondary | ICD-10-CM | POA: Diagnosis present

## 2014-03-09 DIAGNOSIS — Z79899 Other long term (current) drug therapy: Secondary | ICD-10-CM | POA: Diagnosis not present

## 2014-03-09 DIAGNOSIS — Z789 Other specified health status: Secondary | ICD-10-CM | POA: Diagnosis not present

## 2014-03-09 DIAGNOSIS — K7290 Hepatic failure, unspecified without coma: Secondary | ICD-10-CM | POA: Diagnosis not present

## 2014-03-09 DIAGNOSIS — M810 Age-related osteoporosis without current pathological fracture: Secondary | ICD-10-CM | POA: Diagnosis present

## 2014-03-09 DIAGNOSIS — E119 Type 2 diabetes mellitus without complications: Secondary | ICD-10-CM | POA: Diagnosis not present

## 2014-03-09 DIAGNOSIS — K766 Portal hypertension: Secondary | ICD-10-CM | POA: Diagnosis present

## 2014-03-09 DIAGNOSIS — E8770 Fluid overload, unspecified: Secondary | ICD-10-CM | POA: Diagnosis present

## 2014-03-09 DIAGNOSIS — I350 Nonrheumatic aortic (valve) stenosis: Secondary | ICD-10-CM | POA: Diagnosis present

## 2014-03-18 ENCOUNTER — Telehealth: Payer: Self-pay | Admitting: *Deleted

## 2014-03-18 ENCOUNTER — Telehealth: Payer: Self-pay | Admitting: Oncology

## 2014-03-18 DIAGNOSIS — T8131XA Disruption of external operation (surgical) wound, not elsewhere classified, initial encounter: Secondary | ICD-10-CM | POA: Diagnosis not present

## 2014-03-18 DIAGNOSIS — Z4802 Encounter for removal of sutures: Secondary | ICD-10-CM | POA: Diagnosis not present

## 2014-03-18 NOTE — Telephone Encounter (Signed)
S.W. PT AND ADVOSED ON jAN APPT....PT O KAND AWARE....SED ADDED TX.

## 2014-03-18 NOTE — Telephone Encounter (Signed)
Message from pt requesting to reschedule 12/22 office visit to January due to recent surgery. Dr. Benay Spice made aware. Order sent to schedulers.

## 2014-03-22 ENCOUNTER — Ambulatory Visit: Payer: Managed Care, Other (non HMO)

## 2014-03-22 ENCOUNTER — Ambulatory Visit: Payer: Managed Care, Other (non HMO) | Admitting: Oncology

## 2014-03-22 ENCOUNTER — Other Ambulatory Visit: Payer: Managed Care, Other (non HMO)

## 2014-04-05 DIAGNOSIS — E1165 Type 2 diabetes mellitus with hyperglycemia: Secondary | ICD-10-CM | POA: Diagnosis not present

## 2014-04-05 DIAGNOSIS — R188 Other ascites: Secondary | ICD-10-CM | POA: Diagnosis not present

## 2014-04-05 DIAGNOSIS — I10 Essential (primary) hypertension: Secondary | ICD-10-CM | POA: Diagnosis not present

## 2014-04-05 DIAGNOSIS — N949 Unspecified condition associated with female genital organs and menstrual cycle: Secondary | ICD-10-CM | POA: Diagnosis not present

## 2014-04-05 DIAGNOSIS — F329 Major depressive disorder, single episode, unspecified: Secondary | ICD-10-CM | POA: Diagnosis not present

## 2014-04-05 DIAGNOSIS — K7460 Unspecified cirrhosis of liver: Secondary | ICD-10-CM | POA: Diagnosis not present

## 2014-04-08 DIAGNOSIS — R971 Elevated cancer antigen 125 [CA 125]: Secondary | ICD-10-CM | POA: Diagnosis not present

## 2014-04-12 ENCOUNTER — Other Ambulatory Visit (HOSPITAL_COMMUNITY): Payer: Self-pay | Admitting: Internal Medicine

## 2014-04-12 ENCOUNTER — Encounter: Payer: Self-pay | Admitting: Oncology

## 2014-04-12 DIAGNOSIS — K7460 Unspecified cirrhosis of liver: Secondary | ICD-10-CM

## 2014-04-12 DIAGNOSIS — R188 Other ascites: Secondary | ICD-10-CM

## 2014-04-15 ENCOUNTER — Ambulatory Visit (HOSPITAL_COMMUNITY)
Admission: RE | Admit: 2014-04-15 | Discharge: 2014-04-15 | Disposition: A | Discharge status: Home / Self Care | Payer: Medicare Other | Source: Ambulatory Visit | Attending: Internal Medicine | Admitting: Internal Medicine

## 2014-04-15 DIAGNOSIS — R188 Other ascites: Secondary | ICD-10-CM | POA: Diagnosis not present

## 2014-04-15 DIAGNOSIS — K7460 Unspecified cirrhosis of liver: Secondary | ICD-10-CM | POA: Diagnosis not present

## 2014-04-15 NOTE — Procedures (Signed)
Successful US guided paracentesis from RUQ.  Yielded 6.8 liters of serous fluid.  No immediate complications.  Pt tolerated well.   Specimen was not sent for labs.  Tsosie Billing D PA-C 04/15/2014 10:33 AM

## 2014-04-28 ENCOUNTER — Ambulatory Visit (HOSPITAL_BASED_OUTPATIENT_CLINIC_OR_DEPARTMENT_OTHER): Payer: Medicare Other | Admitting: Oncology

## 2014-04-28 ENCOUNTER — Other Ambulatory Visit (HOSPITAL_BASED_OUTPATIENT_CLINIC_OR_DEPARTMENT_OTHER): Payer: Medicare Other

## 2014-04-28 ENCOUNTER — Encounter: Payer: Self-pay | Admitting: Oncology

## 2014-04-28 ENCOUNTER — Ambulatory Visit: Payer: Managed Care, Other (non HMO)

## 2014-04-28 ENCOUNTER — Telehealth: Payer: Self-pay | Admitting: Oncology

## 2014-04-28 VITALS — BP 133/69 | HR 93 | Temp 98.4°F | Resp 18 | Ht 65.0 in | Wt 172.8 lb

## 2014-04-28 DIAGNOSIS — D61818 Other pancytopenia: Secondary | ICD-10-CM | POA: Diagnosis not present

## 2014-04-28 DIAGNOSIS — D509 Iron deficiency anemia, unspecified: Secondary | ICD-10-CM

## 2014-04-28 LAB — CBC WITH DIFFERENTIAL/PLATELET
BASO%: 0.7 % (ref 0.0–2.0)
Basophils Absolute: 0 10*3/uL (ref 0.0–0.1)
EOS%: 2.2 % (ref 0.0–7.0)
Eosinophils Absolute: 0.1 10*3/uL (ref 0.0–0.5)
HEMATOCRIT: 33.2 % — AB (ref 34.8–46.6)
HEMOGLOBIN: 11 g/dL — AB (ref 11.6–15.9)
LYMPH%: 11.9 % — ABNORMAL LOW (ref 14.0–49.7)
MCH: 28.8 pg (ref 25.1–34.0)
MCHC: 33.3 g/dL (ref 31.5–36.0)
MCV: 86.6 fL (ref 79.5–101.0)
MONO#: 0.2 10*3/uL (ref 0.1–0.9)
MONO%: 6.7 % (ref 0.0–14.0)
NEUT%: 78.5 % — AB (ref 38.4–76.8)
NEUTROS ABS: 2.7 10*3/uL (ref 1.5–6.5)
PLATELETS: 97 10*3/uL — AB (ref 145–400)
RBC: 3.83 10*6/uL (ref 3.70–5.45)
RDW: 14.9 % — ABNORMAL HIGH (ref 11.2–14.5)
WBC: 3.4 10*3/uL — AB (ref 3.9–10.3)
lymph#: 0.4 10*3/uL — ABNORMAL LOW (ref 0.9–3.3)

## 2014-04-28 LAB — FERRITIN CHCC: Ferritin: 136 ng/ml (ref 9–269)

## 2014-04-28 NOTE — Progress Notes (Signed)
  [facility] OFFICE PROGRESS NOTE   Diagnosis: Cirrhosis, history of iron deficiency  INTERVAL HISTORY:   She returns as scheduled for a she last received IV iron in September 2015. She is not feel iron deficient. She continues to undergo intermittent paracentesis procedures for relief of ascites. She last had a paracentesis on 04/15/2014.  She underwent an exploratory laparotomy and lysis of adhesions 03/02/2014 at [facility]. No malignancy was identified.  Objective:  Vital signs in last 24 hours:  Blood pressure 133/69, pulse 93, temperature 98.4 F (36.9 C), temperature source Oral, resp. rate 18, height 5\' 5"  (1.651 m), weight 172 lb 12.8 oz (78.382 kg).    Resp: Lungs clear bilaterally Cardio: Regular rate and rhythm GI: Distended with ascites, the spleen tip is palpable in the left subcostal region Vascular: No leg edema   Lab Results:  Lab Results  Component Value Date   WBC 3.4* 04/28/2014   HGB 11.0* 04/28/2014   HCT 33.2* 04/28/2014   MCV 86.6 04/28/2014   PLT 97* 04/28/2014   NEUTROABS 2.7 04/28/2014    Medications: I have reviewed the patient's current medications.  Assessment/Plan: 1. NASH.  2. Cirrhosis secondary to #1.  3. Splenomegaly secondary to #1.  4. Chronic pancytopenia secondary to #2 and #3. 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 She is maintained on intermittent IV iron therapy. 5. ? Abdominal hernia 08/10/2013. She reported an abdominal MRI was scheduled at [facility].  6. ? Small right axillary lymph node 08/10/2013. No adenopathy noted on exam 11/30/2013. 7. Exploratory laparotomy with omentum and peritoneal biopsies at [facility] 03/02/2014 with no malignancy identified   Disposition:  She appears stable from a hematologic standpoint. We will follow-up on the ferritin level from today. She will receive IV iron as indicated. Ms. Ueda will be scheduled for labs every 3 months and an office visit  in 9 months.  Betsy Coder, MD  04/28/2014  12:33 PM

## 2014-04-28 NOTE — Telephone Encounter (Signed)
, °

## 2014-04-29 ENCOUNTER — Telehealth: Payer: Self-pay | Admitting: *Deleted

## 2014-04-29 NOTE — Telephone Encounter (Signed)
Called pt, informed her she will not need Feraheme, per Dr. Benay Spice. Follow up as scheduled. She voiced understanding.

## 2014-04-29 NOTE — Telephone Encounter (Signed)
-----   Message from [facility] My[facility] sent at 04/29/2014 12:31 PM EST ----- Regarding: Your message may not be read Contact: [phone]    ----- Delivery failure of internet email alert     ----- Failed to log attempted delivery of internet email alert  Tickler type: Message Message Id(WMG): [id number] UYQI Response: [id number] Patient: [id number],[id number]([id number]) Recipient: () Internet alert email: [email or web address]     ----- Original WMG message to the patient ----- Sent: 04/29/2014 12:31 PM From: Ludwig Lean, RN To: Antrobus,Laysha Message Type: Patient Medical Advice Request Subject: RE: Non-Urgent Medical Question Mrs. Delossantos,  Dr. Benay Spice has reviewed your lab. You will not need Feraheme. Your ferritin remains in the normal range. We will cancel the infusion appt and recheck labs in April.  Thank you for using MyChart, Lashonda Sonneborn,RN  ----- Message -----    From: Deines,Kiley    Sent: 04/28/2014 10:55 PM EST      To: Betsy Coder, MD Subject: Non-Urgent Medical Question  I was in the office yesterday to have my ferritin checked.  I received the results last night.  My levels have dropped a lot since the last test I had done.  Please ask Dr. Benay Spice if I should come in for [facility].  I have an appointment for an infusion on Feb. 4th should I need it.  Thank you for your help.  Gloris Brennan

## 2014-05-05 ENCOUNTER — Ambulatory Visit: Payer: Managed Care, Other (non HMO)

## 2014-05-10 DIAGNOSIS — R188 Other ascites: Secondary | ICD-10-CM | POA: Diagnosis not present

## 2014-05-10 DIAGNOSIS — K7469 Other cirrhosis of liver: Secondary | ICD-10-CM | POA: Diagnosis not present

## 2014-05-10 DIAGNOSIS — J019 Acute sinusitis, unspecified: Secondary | ICD-10-CM | POA: Diagnosis not present

## 2014-05-16 ENCOUNTER — Other Ambulatory Visit (HOSPITAL_COMMUNITY): Payer: Self-pay | Admitting: Unknown Physician Specialty

## 2014-05-16 DIAGNOSIS — K7469 Other cirrhosis of liver: Secondary | ICD-10-CM

## 2014-05-16 DIAGNOSIS — R188 Other ascites: Secondary | ICD-10-CM

## 2014-05-20 ENCOUNTER — Encounter (HOSPITAL_COMMUNITY): Payer: Self-pay

## 2014-05-20 ENCOUNTER — Ambulatory Visit (HOSPITAL_COMMUNITY)
Admission: RE | Admit: 2014-05-20 | Discharge: 2014-05-20 | Disposition: A | Discharge status: Home / Self Care | Payer: Medicare Other | Source: Ambulatory Visit | Attending: Internal Medicine | Admitting: Internal Medicine

## 2014-05-20 ENCOUNTER — Encounter (HOSPITAL_COMMUNITY)
Admission: RE | Admit: 2014-05-20 | Discharge: 2014-05-20 | Disposition: A | Discharge status: Home / Self Care | Payer: Medicare Other | Source: Ambulatory Visit | Attending: Unknown Physician Specialty | Admitting: Unknown Physician Specialty

## 2014-05-20 DIAGNOSIS — K7469 Other cirrhosis of liver: Secondary | ICD-10-CM | POA: Insufficient documentation

## 2014-05-20 DIAGNOSIS — R188 Other ascites: Secondary | ICD-10-CM | POA: Diagnosis not present

## 2014-05-20 DIAGNOSIS — K7460 Unspecified cirrhosis of liver: Secondary | ICD-10-CM | POA: Diagnosis not present

## 2014-05-20 MED ORDER — ALBUMIN HUMAN 25 % IV SOLN
50.0000 g | Freq: Once | INTRAVENOUS | Status: AC
  Administered 2014-05-20: 50 g via INTRAVENOUS
  Filled 2014-05-20: qty 200

## 2014-05-20 MED ORDER — SODIUM CHLORIDE 0.9 % IV SOLN
INTRAVENOUS | Status: DC
  Administered 2014-05-20: 12:00:00 via INTRAVENOUS

## 2014-05-20 NOTE — Discharge Instructions (Signed)
Albumin injection °What is this medicine? °ALBUMIN (al BYOO min) is used to treat or prevent shock following serious injury, bleeding, surgery, or burns by increasing the volume of blood plasma. This medicine can also replace low blood protein. °This medicine may be used for other purposes; ask your health care provider or pharmacist if you have questions. °COMMON BRAND NAME(S): Albuked, Albumarc, Albuminar, Albutein, Buminate, Flexbumin, Kedbumin, Macrotec, Plasbumin °What should I tell my health care provider before I take this medicine? °They need to know if you have any of the following conditions: °-anemia °-heart disease °-kidney disease °-an unusual or allergic reaction to albumin, other medicines, foods, dyes, or preservatives °-pregnant or trying to get pregnant °-breast-feeding °How should I use this medicine? °This medicine is for infusion into a vein. It is given by a health-care professional in a hospital or clinic. °Talk to your pediatrician regarding the use of this medicine in children. While this drug may be prescribed for selected conditions, precautions do apply. °Overdosage: If you think you have taken too much of this medicine contact a poison control center or emergency room at once. °NOTE: This medicine is only for you. Do not share this medicine with others. °What if I miss a dose? °This does not apply. °What may interact with this medicine? °Interactions are not expected. °This list may not describe all possible interactions. Give your health care provider a list of all the medicines, herbs, non-prescription drugs, or dietary supplements you use. Also tell them if you smoke, drink alcohol, or use illegal drugs. Some items may interact with your medicine. °What should I watch for while using this medicine? °Your condition will be closely monitored while you receive this medicine. °Some products are derived from human plasma, and there is a small risk that these products may contain certain  types of virus or bacteria. All products are processed to kill most viruses and bacteria. If you have questions concerning the risk of infections, discuss them with your doctor or health care professional. °What side effects may I notice from receiving this medicine? °Side effects that you should report to your doctor or health care professional as soon as possible: °-allergic reactions like skin rash, itching or hives, swelling of the face, lips, or tongue °-breathing problems °-changes in heartbeat °-fever, chills °-pain, redness or swelling at the injection site °-signs of viral infection including fever, drowsiness, chills, runny nose followed in about 2 weeks by a rash and joint pain °-tightness in the chest °Side effects that usually do not require medical attention (report to your doctor or health care professional if they continue or are bothersome): °-increased salivation °-nausea, vomiting °This list may not describe all possible side effects. Call your doctor for medical advice about side effects. You may report side effects to FDA at 1-800-FDA-1088. °Where should I keep my medicine? °This does not apply. You will not be given this medicine to store at home. °NOTE: This sheet is a summary. It may not cover all possible information. If you have questions about this medicine, talk to your doctor, pharmacist, or health care provider. °© 2015, Elsevier/Gold Standard. (2007-06-11 10:18:55) ° °

## 2014-05-22 IMAGING — US US PARACENTESIS
1 series · 10 of 10 positions shown · non-contrast
Comparison: None.

CLINICAL DATA: Abdominal distention and ascites. Request diagnostic
and therapeutic paracentesis.

EXAM:
ULTRASOUND GUIDED PARACENTESIS

[Series 1: us paracentesis · 0.25mm/px · 10 of 10 slices shown]
[im 1/10]
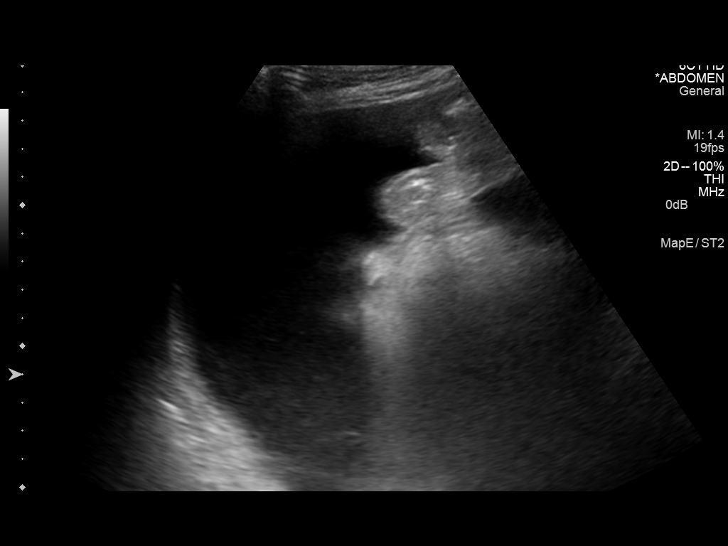
[im 2/10]
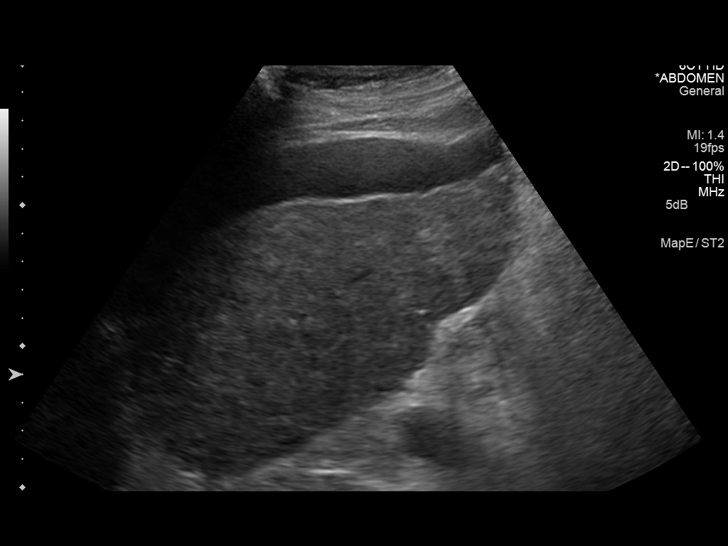
[im 3/10]
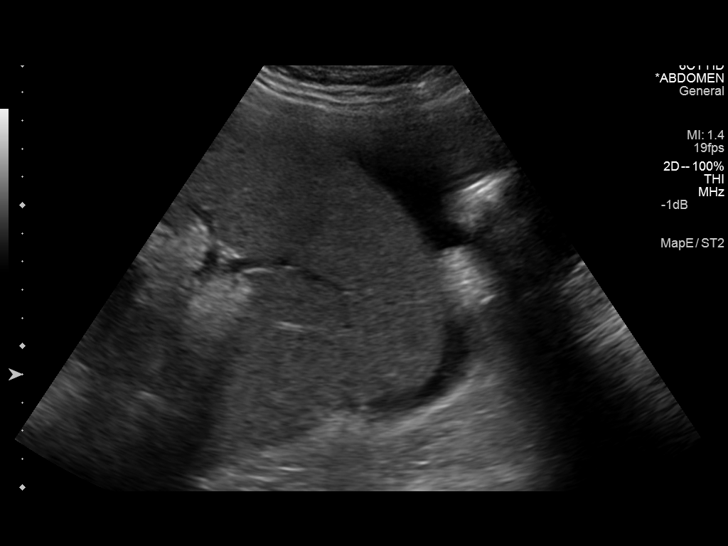
[im 4/10]
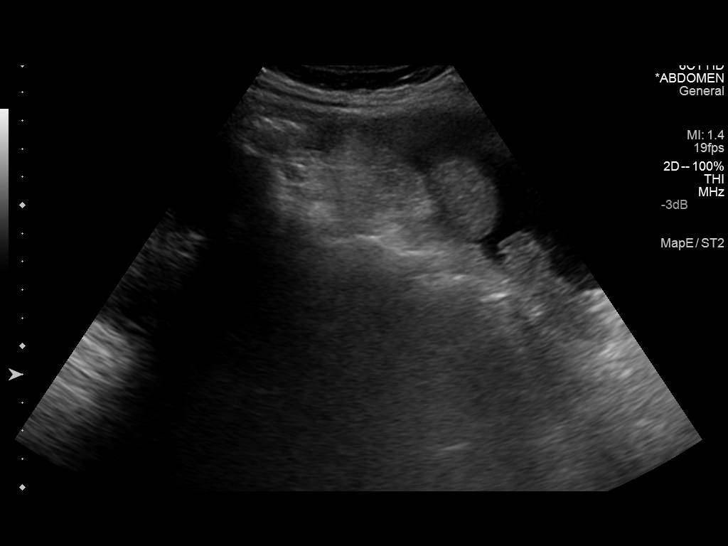
[im 5/10]
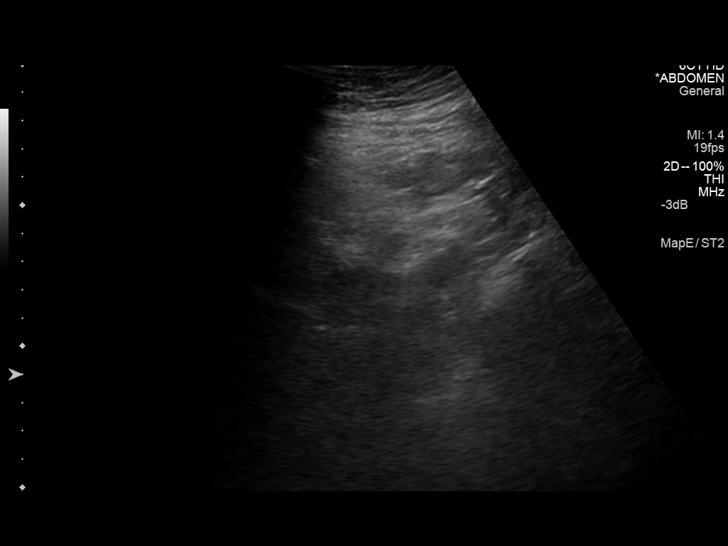
[im 6/10]
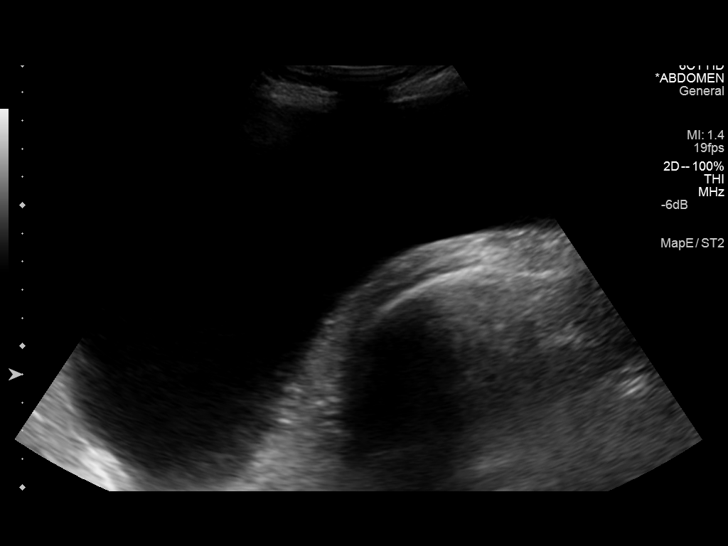
[im 7/10]
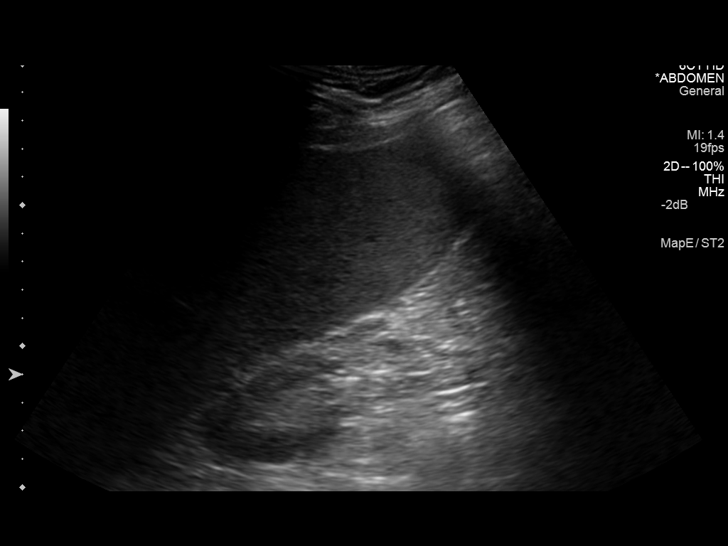
[im 8/10]
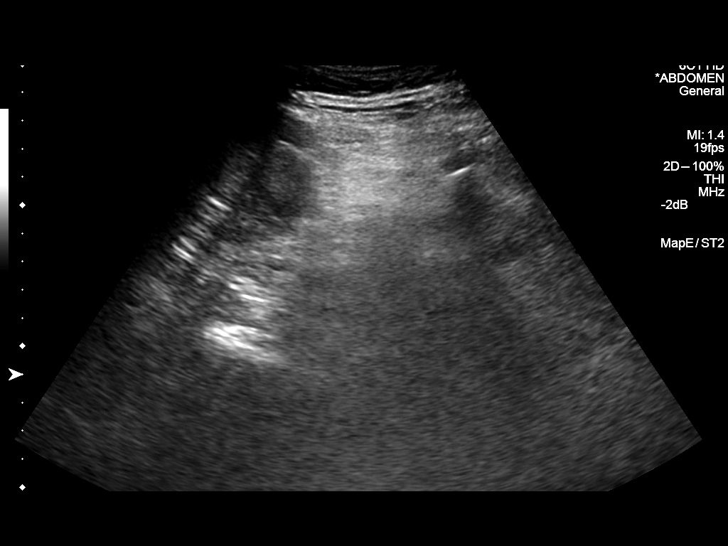
[im 9/10]
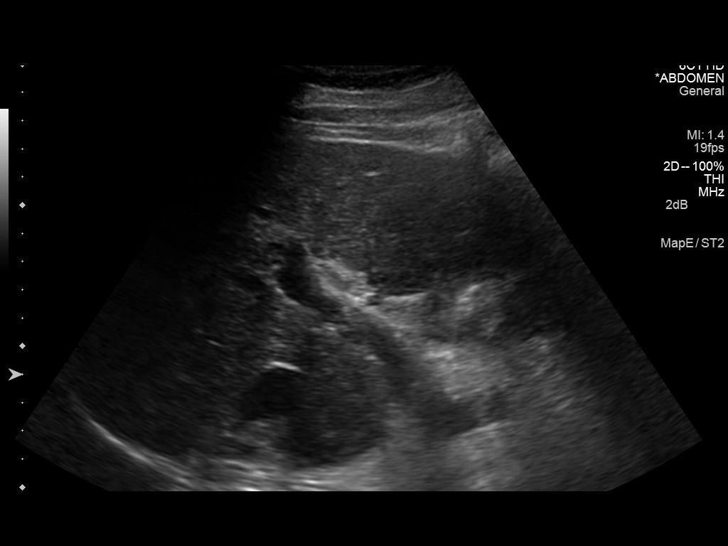
[im 10/10]
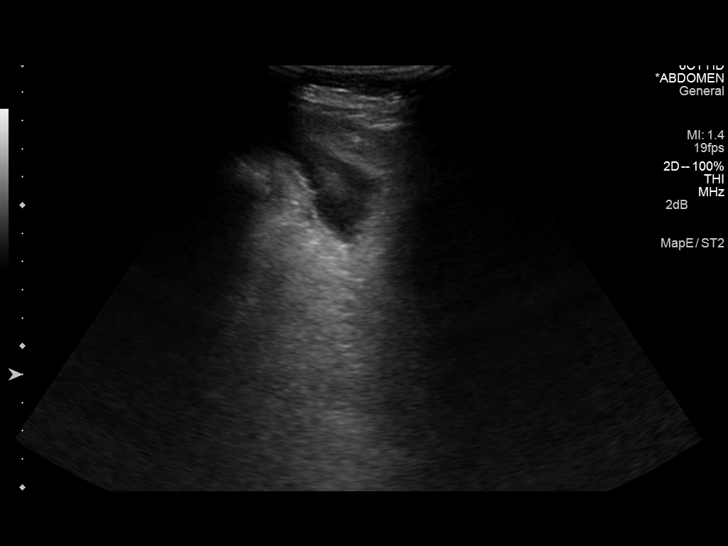

[10 of 10 positions shown; findings below may reference images not displayed]

PROCEDURE:
An ultrasound guided paracentesis was thoroughly discussed with the
patient and questions answered. The benefits, risks, alternatives
and complications were also discussed. The patient understands and
wishes to proceed with the procedure. Written consent was obtained.

Ultrasound was performed to localize and mark an adequate pocket of
fluid in the right lower quadrant of the abdomen. The area was then
prepped and draped in the normal sterile fashion. 1% Lidocaine was
used for local anesthesia. Under ultrasound guidance a 19 gauge Yueh
catheter was introduced. Paracentesis was performed. The catheter
was removed and a dressing applied.

COMPLICATIONS:
None immediate
FINDINGS: A total of approximately 3.2 L of clear yellow fluid was removed. A
fluid sample was sent for laboratory analysis.
IMPRESSION: Successful ultrasound guided paracentesis yielding 3.2 L of ascites.

## 2014-05-23 DIAGNOSIS — K7460 Unspecified cirrhosis of liver: Secondary | ICD-10-CM | POA: Diagnosis not present

## 2014-05-23 DIAGNOSIS — I8500 Esophageal varices without bleeding: Secondary | ICD-10-CM | POA: Diagnosis not present

## 2014-05-23 DIAGNOSIS — R161 Splenomegaly, not elsewhere classified: Secondary | ICD-10-CM | POA: Diagnosis not present

## 2014-05-23 DIAGNOSIS — R188 Other ascites: Secondary | ICD-10-CM | POA: Diagnosis not present

## 2014-05-23 DIAGNOSIS — K7290 Hepatic failure, unspecified without coma: Secondary | ICD-10-CM | POA: Diagnosis not present

## 2014-05-23 DIAGNOSIS — I864 Gastric varices: Secondary | ICD-10-CM | POA: Diagnosis not present

## 2014-05-23 DIAGNOSIS — Z79899 Other long term (current) drug therapy: Secondary | ICD-10-CM | POA: Diagnosis not present

## 2014-05-23 DIAGNOSIS — K766 Portal hypertension: Secondary | ICD-10-CM | POA: Diagnosis not present

## 2014-05-23 DIAGNOSIS — K760 Fatty (change of) liver, not elsewhere classified: Secondary | ICD-10-CM | POA: Diagnosis not present

## 2014-05-23 DIAGNOSIS — K7469 Other cirrhosis of liver: Secondary | ICD-10-CM | POA: Diagnosis not present

## 2014-06-15 ENCOUNTER — Other Ambulatory Visit: Payer: Self-pay

## 2014-06-15 DIAGNOSIS — Z1231 Encounter for screening mammogram for malignant neoplasm of breast: Secondary | ICD-10-CM

## 2014-06-21 ENCOUNTER — Other Ambulatory Visit (HOSPITAL_COMMUNITY): Payer: Self-pay | Admitting: Internal Medicine

## 2014-06-21 DIAGNOSIS — M899 Disorder of bone, unspecified: Secondary | ICD-10-CM | POA: Diagnosis not present

## 2014-06-21 DIAGNOSIS — F329 Major depressive disorder, single episode, unspecified: Secondary | ICD-10-CM | POA: Diagnosis not present

## 2014-06-21 DIAGNOSIS — E780 Pure hypercholesterolemia: Secondary | ICD-10-CM | POA: Diagnosis not present

## 2014-06-21 DIAGNOSIS — R188 Other ascites: Secondary | ICD-10-CM

## 2014-06-21 DIAGNOSIS — Z0001 Encounter for general adult medical examination with abnormal findings: Secondary | ICD-10-CM | POA: Diagnosis not present

## 2014-06-21 DIAGNOSIS — K7469 Other cirrhosis of liver: Secondary | ICD-10-CM | POA: Diagnosis not present

## 2014-06-21 DIAGNOSIS — I350 Nonrheumatic aortic (valve) stenosis: Secondary | ICD-10-CM | POA: Diagnosis not present

## 2014-06-21 DIAGNOSIS — Z23 Encounter for immunization: Secondary | ICD-10-CM | POA: Diagnosis not present

## 2014-06-21 DIAGNOSIS — I10 Essential (primary) hypertension: Secondary | ICD-10-CM | POA: Diagnosis not present

## 2014-06-21 DIAGNOSIS — Z1389 Encounter for screening for other disorder: Secondary | ICD-10-CM | POA: Diagnosis not present

## 2014-06-28 ENCOUNTER — Ambulatory Visit
Admission: RE | Admit: 2014-06-28 | Discharge: 2014-06-28 | Disposition: A | Discharge status: Home / Self Care | Payer: Medicare Other | Source: Ambulatory Visit

## 2014-06-28 DIAGNOSIS — Z1231 Encounter for screening mammogram for malignant neoplasm of breast: Secondary | ICD-10-CM

## 2014-07-12 ENCOUNTER — Ambulatory Visit (HOSPITAL_COMMUNITY)
Admission: RE | Admit: 2014-07-12 | Discharge: 2014-07-12 | Disposition: A | Discharge status: Home / Self Care | Payer: Medicare Other | Source: Ambulatory Visit | Attending: Internal Medicine | Admitting: Internal Medicine

## 2014-07-12 DIAGNOSIS — R188 Other ascites: Secondary | ICD-10-CM | POA: Diagnosis not present

## 2014-07-12 DIAGNOSIS — K7460 Unspecified cirrhosis of liver: Secondary | ICD-10-CM | POA: Diagnosis not present

## 2014-07-12 NOTE — Procedures (Signed)
Ultrasound-guided therapeutic paracentesis performed yielding 4.5 L yellow fluid. No immediate complications.

## 2014-07-21 ENCOUNTER — Encounter: Payer: Self-pay | Admitting: Oncology

## 2014-07-22 ENCOUNTER — Other Ambulatory Visit: Payer: Self-pay | Admitting: *Deleted

## 2014-07-22 ENCOUNTER — Telehealth: Payer: Self-pay | Admitting: Oncology

## 2014-07-22 NOTE — Telephone Encounter (Signed)
Sent msg to add Feraheme X2 per 04/22 POF, will contact pt with D/T and also staff msg MD.... KJ

## 2014-07-23 DIAGNOSIS — K7460 Unspecified cirrhosis of liver: Secondary | ICD-10-CM | POA: Diagnosis not present

## 2014-07-23 DIAGNOSIS — Z4682 Encounter for fitting and adjustment of non-vascular catheter: Secondary | ICD-10-CM | POA: Diagnosis not present

## 2014-07-23 DIAGNOSIS — J90 Pleural effusion, not elsewhere classified: Secondary | ICD-10-CM | POA: Diagnosis not present

## 2014-07-23 DIAGNOSIS — I8501 Esophageal varices with bleeding: Secondary | ICD-10-CM | POA: Diagnosis not present

## 2014-07-23 DIAGNOSIS — R918 Other nonspecific abnormal finding of lung field: Secondary | ICD-10-CM | POA: Diagnosis not present

## 2014-07-23 DIAGNOSIS — E118 Type 2 diabetes mellitus with unspecified complications: Secondary | ICD-10-CM | POA: Diagnosis not present

## 2014-07-23 DIAGNOSIS — Z87891 Personal history of nicotine dependence: Secondary | ICD-10-CM | POA: Diagnosis not present

## 2014-07-23 DIAGNOSIS — K922 Gastrointestinal hemorrhage, unspecified: Secondary | ICD-10-CM | POA: Diagnosis not present

## 2014-07-23 DIAGNOSIS — E878 Other disorders of electrolyte and fluid balance, not elsewhere classified: Secondary | ICD-10-CM | POA: Diagnosis present

## 2014-07-23 DIAGNOSIS — Z794 Long term (current) use of insulin: Secondary | ICD-10-CM | POA: Diagnosis not present

## 2014-07-23 DIAGNOSIS — J9811 Atelectasis: Secondary | ICD-10-CM | POA: Diagnosis not present

## 2014-07-23 DIAGNOSIS — D696 Thrombocytopenia, unspecified: Secondary | ICD-10-CM | POA: Diagnosis present

## 2014-07-23 DIAGNOSIS — R188 Other ascites: Secondary | ICD-10-CM | POA: Diagnosis not present

## 2014-07-23 DIAGNOSIS — R262 Difficulty in walking, not elsewhere classified: Secondary | ICD-10-CM | POA: Diagnosis not present

## 2014-07-23 DIAGNOSIS — E875 Hyperkalemia: Secondary | ICD-10-CM | POA: Diagnosis not present

## 2014-07-23 DIAGNOSIS — I10 Essential (primary) hypertension: Secondary | ICD-10-CM | POA: Diagnosis not present

## 2014-07-23 DIAGNOSIS — E872 Acidosis: Secondary | ICD-10-CM | POA: Diagnosis present

## 2014-07-23 DIAGNOSIS — K7290 Hepatic failure, unspecified without coma: Secondary | ICD-10-CM | POA: Diagnosis present

## 2014-07-23 DIAGNOSIS — I350 Nonrheumatic aortic (valve) stenosis: Secondary | ICD-10-CM | POA: Diagnosis not present

## 2014-07-23 DIAGNOSIS — R410 Disorientation, unspecified: Secondary | ICD-10-CM | POA: Diagnosis not present

## 2014-07-23 DIAGNOSIS — K7469 Other cirrhosis of liver: Secondary | ICD-10-CM | POA: Diagnosis not present

## 2014-07-23 DIAGNOSIS — Z01810 Encounter for preprocedural cardiovascular examination: Secondary | ICD-10-CM | POA: Diagnosis not present

## 2014-07-23 DIAGNOSIS — E785 Hyperlipidemia, unspecified: Secondary | ICD-10-CM | POA: Diagnosis present

## 2014-07-23 DIAGNOSIS — I864 Gastric varices: Secondary | ICD-10-CM | POA: Diagnosis not present

## 2014-07-23 DIAGNOSIS — K7581 Nonalcoholic steatohepatitis (NASH): Secondary | ICD-10-CM | POA: Diagnosis not present

## 2014-07-23 DIAGNOSIS — K317 Polyp of stomach and duodenum: Secondary | ICD-10-CM | POA: Diagnosis not present

## 2014-07-23 DIAGNOSIS — G9340 Encephalopathy, unspecified: Secondary | ICD-10-CM | POA: Diagnosis not present

## 2014-07-23 DIAGNOSIS — R509 Fever, unspecified: Secondary | ICD-10-CM | POA: Diagnosis not present

## 2014-07-23 DIAGNOSIS — E119 Type 2 diabetes mellitus without complications: Secondary | ICD-10-CM | POA: Diagnosis present

## 2014-07-23 DIAGNOSIS — K920 Hematemesis: Secondary | ICD-10-CM | POA: Diagnosis not present

## 2014-07-26 ENCOUNTER — Telehealth: Payer: Self-pay | Admitting: Oncology

## 2014-07-26 NOTE — Telephone Encounter (Signed)
Pt's husband called to cancel labs/IVF's for 04/28 pt is at [facility] has been since Saturday and has just been taken out of ICU and put into a regular room..... KJ

## 2014-07-28 ENCOUNTER — Other Ambulatory Visit: Payer: Medicare Other

## 2014-07-28 ENCOUNTER — Ambulatory Visit: Payer: Medicare Other

## 2014-08-04 ENCOUNTER — Ambulatory Visit: Payer: Medicare Other

## 2014-08-04 ENCOUNTER — Other Ambulatory Visit: Payer: Self-pay | Admitting: Oncology

## 2014-08-08 ENCOUNTER — Encounter: Payer: Self-pay | Admitting: Oncology

## 2014-09-02 ENCOUNTER — Other Ambulatory Visit (HOSPITAL_COMMUNITY): Payer: Self-pay | Admitting: Internal Medicine

## 2014-09-02 DIAGNOSIS — R188 Other ascites: Secondary | ICD-10-CM

## 2014-09-07 ENCOUNTER — Ambulatory Visit (HOSPITAL_COMMUNITY): Payer: Medicare Other

## 2014-09-09 ENCOUNTER — Telehealth (HOSPITAL_COMMUNITY): Payer: Self-pay

## 2014-09-09 ENCOUNTER — Ambulatory Visit (HOSPITAL_COMMUNITY): Admission: RE | Admit: 2014-09-09 | Payer: Medicare Other | Source: Ambulatory Visit

## 2014-09-09 NOTE — Telephone Encounter (Signed)
Patient called canceled appt said she was sick with the stomach bug, she rescheduled her appt Called department no answer, left message with Alwyn Ren that appt has been canceled and rescheduled.

## 2014-09-10 DIAGNOSIS — R197 Diarrhea, unspecified: Secondary | ICD-10-CM | POA: Diagnosis not present

## 2014-09-11 DIAGNOSIS — R197 Diarrhea, unspecified: Secondary | ICD-10-CM | POA: Diagnosis not present

## 2014-09-12 ENCOUNTER — Ambulatory Visit (HOSPITAL_COMMUNITY)
Admission: RE | Admit: 2014-09-12 | Discharge: 2014-09-12 | Disposition: A | Discharge status: Home / Self Care | Payer: Medicare Other | Source: Ambulatory Visit | Attending: Internal Medicine | Admitting: Internal Medicine

## 2014-09-12 DIAGNOSIS — R188 Other ascites: Secondary | ICD-10-CM

## 2014-09-12 DIAGNOSIS — K7460 Unspecified cirrhosis of liver: Secondary | ICD-10-CM | POA: Diagnosis not present

## 2014-09-12 NOTE — Procedures (Signed)
Successful US guided paracentesis from RLQ.  Yielded 6 liters of serous fluid.  No immediate complications.  Pt tolerated well.   Specimen was not sent for labs.  Tsosie Billing D PA-C 09/12/2014 2:28 PM

## 2014-10-15 DIAGNOSIS — K766 Portal hypertension: Secondary | ICD-10-CM | POA: Diagnosis not present

## 2014-10-15 DIAGNOSIS — I350 Nonrheumatic aortic (valve) stenosis: Secondary | ICD-10-CM | POA: Diagnosis present

## 2014-10-15 DIAGNOSIS — F329 Major depressive disorder, single episode, unspecified: Secondary | ICD-10-CM | POA: Diagnosis present

## 2014-10-15 DIAGNOSIS — D61818 Other pancytopenia: Secondary | ICD-10-CM | POA: Diagnosis present

## 2014-10-15 DIAGNOSIS — I8511 Secondary esophageal varices with bleeding: Secondary | ICD-10-CM | POA: Diagnosis present

## 2014-10-15 DIAGNOSIS — Z794 Long term (current) use of insulin: Secondary | ICD-10-CM | POA: Diagnosis not present

## 2014-10-15 DIAGNOSIS — E119 Type 2 diabetes mellitus without complications: Secondary | ICD-10-CM | POA: Diagnosis not present

## 2014-10-15 DIAGNOSIS — I8510 Secondary esophageal varices without bleeding: Secondary | ICD-10-CM | POA: Diagnosis not present

## 2014-10-15 DIAGNOSIS — K625 Hemorrhage of anus and rectum: Secondary | ICD-10-CM | POA: Diagnosis not present

## 2014-10-15 DIAGNOSIS — I8501 Esophageal varices with bleeding: Secondary | ICD-10-CM | POA: Diagnosis not present

## 2014-10-15 DIAGNOSIS — M1990 Unspecified osteoarthritis, unspecified site: Secondary | ICD-10-CM | POA: Diagnosis present

## 2014-10-15 DIAGNOSIS — K31819 Angiodysplasia of stomach and duodenum without bleeding: Secondary | ICD-10-CM | POA: Diagnosis not present

## 2014-10-15 DIAGNOSIS — E780 Pure hypercholesterolemia: Secondary | ICD-10-CM | POA: Diagnosis present

## 2014-10-15 DIAGNOSIS — D649 Anemia, unspecified: Secondary | ICD-10-CM | POA: Diagnosis not present

## 2014-10-15 DIAGNOSIS — K7460 Unspecified cirrhosis of liver: Secondary | ICD-10-CM | POA: Diagnosis not present

## 2014-10-15 DIAGNOSIS — E785 Hyperlipidemia, unspecified: Secondary | ICD-10-CM | POA: Diagnosis not present

## 2014-10-15 DIAGNOSIS — Z87891 Personal history of nicotine dependence: Secondary | ICD-10-CM | POA: Diagnosis not present

## 2014-10-15 DIAGNOSIS — K7581 Nonalcoholic steatohepatitis (NASH): Secondary | ICD-10-CM | POA: Diagnosis not present

## 2014-10-15 DIAGNOSIS — D509 Iron deficiency anemia, unspecified: Secondary | ICD-10-CM | POA: Diagnosis present

## 2014-10-15 DIAGNOSIS — I10 Essential (primary) hypertension: Secondary | ICD-10-CM | POA: Diagnosis not present

## 2014-10-15 DIAGNOSIS — Z8249 Family history of ischemic heart disease and other diseases of the circulatory system: Secondary | ICD-10-CM | POA: Diagnosis not present

## 2014-10-15 DIAGNOSIS — Z833 Family history of diabetes mellitus: Secondary | ICD-10-CM | POA: Diagnosis not present

## 2014-10-15 DIAGNOSIS — E109 Type 1 diabetes mellitus without complications: Secondary | ICD-10-CM | POA: Diagnosis not present

## 2014-10-15 DIAGNOSIS — E118 Type 2 diabetes mellitus with unspecified complications: Secondary | ICD-10-CM | POA: Diagnosis not present

## 2014-10-15 DIAGNOSIS — K921 Melena: Secondary | ICD-10-CM | POA: Diagnosis not present

## 2014-10-15 DIAGNOSIS — K7290 Hepatic failure, unspecified without coma: Secondary | ICD-10-CM | POA: Diagnosis present

## 2014-10-15 DIAGNOSIS — E7220 Disorder of urea cycle metabolism, unspecified: Secondary | ICD-10-CM | POA: Diagnosis present

## 2014-10-15 DIAGNOSIS — R188 Other ascites: Secondary | ICD-10-CM | POA: Diagnosis not present

## 2014-10-15 DIAGNOSIS — K760 Fatty (change of) liver, not elsewhere classified: Secondary | ICD-10-CM | POA: Diagnosis not present

## 2014-10-15 DIAGNOSIS — K3189 Other diseases of stomach and duodenum: Secondary | ICD-10-CM | POA: Diagnosis not present

## 2014-10-15 DIAGNOSIS — K922 Gastrointestinal hemorrhage, unspecified: Secondary | ICD-10-CM | POA: Diagnosis not present

## 2014-10-15 DIAGNOSIS — K5790 Diverticulosis of intestine, part unspecified, without perforation or abscess without bleeding: Secondary | ICD-10-CM | POA: Diagnosis present

## 2014-10-21 DIAGNOSIS — R188 Other ascites: Secondary | ICD-10-CM | POA: Diagnosis not present

## 2014-10-21 DIAGNOSIS — I8500 Esophageal varices without bleeding: Secondary | ICD-10-CM | POA: Diagnosis not present

## 2014-10-21 DIAGNOSIS — D638 Anemia in other chronic diseases classified elsewhere: Secondary | ICD-10-CM | POA: Diagnosis not present

## 2014-10-21 DIAGNOSIS — K7469 Other cirrhosis of liver: Secondary | ICD-10-CM | POA: Diagnosis not present

## 2014-10-21 DIAGNOSIS — M899 Disorder of bone, unspecified: Secondary | ICD-10-CM | POA: Diagnosis not present

## 2014-10-21 DIAGNOSIS — I10 Essential (primary) hypertension: Secondary | ICD-10-CM | POA: Diagnosis not present

## 2014-10-21 DIAGNOSIS — D61818 Other pancytopenia: Secondary | ICD-10-CM | POA: Diagnosis not present

## 2014-10-21 DIAGNOSIS — E1165 Type 2 diabetes mellitus with hyperglycemia: Secondary | ICD-10-CM | POA: Diagnosis not present

## 2014-10-27 ENCOUNTER — Other Ambulatory Visit (HOSPITAL_BASED_OUTPATIENT_CLINIC_OR_DEPARTMENT_OTHER): Payer: Medicare Other

## 2014-10-27 DIAGNOSIS — D509 Iron deficiency anemia, unspecified: Secondary | ICD-10-CM

## 2014-10-27 DIAGNOSIS — D61818 Other pancytopenia: Secondary | ICD-10-CM

## 2014-10-27 LAB — CBC WITH DIFFERENTIAL/PLATELET
BASO%: 1.3 % (ref 0.0–2.0)
Basophils Absolute: 0 10*3/uL (ref 0.0–0.1)
EOS ABS: 0.1 10*3/uL (ref 0.0–0.5)
EOS%: 2.8 % (ref 0.0–7.0)
HEMATOCRIT: 32 % — AB (ref 34.8–46.6)
HEMOGLOBIN: 10.7 g/dL — AB (ref 11.6–15.9)
LYMPH%: 16.3 % (ref 14.0–49.7)
MCH: 28.2 pg (ref 25.1–34.0)
MCHC: 33.4 g/dL (ref 31.5–36.0)
MCV: 84.5 fL (ref 79.5–101.0)
MONO#: 0.3 10*3/uL (ref 0.1–0.9)
MONO%: 9.1 % (ref 0.0–14.0)
NEUT#: 2.1 10*3/uL (ref 1.5–6.5)
NEUT%: 70.5 % (ref 38.4–76.8)
PLATELETS: 121 10*3/uL — AB (ref 145–400)
RBC: 3.78 10*6/uL (ref 3.70–5.45)
RDW: 16.8 % — ABNORMAL HIGH (ref 11.2–14.5)
WBC: 2.9 10*3/uL — ABNORMAL LOW (ref 3.9–10.3)
lymph#: 0.5 10*3/uL — ABNORMAL LOW (ref 0.9–3.3)

## 2014-10-27 LAB — FERRITIN CHCC: Ferritin: 41 ng/ml (ref 9–269)

## 2014-10-28 ENCOUNTER — Telehealth: Payer: Self-pay | Admitting: *Deleted

## 2014-10-28 NOTE — Telephone Encounter (Signed)
-----   Message from Ladell Pier, MD sent at 10/27/2014  5:37 PM EDT ----- Please call patient,iron stores are lower-but still in norma range, hb is stable, f/u as scheduled

## 2014-10-28 NOTE — Telephone Encounter (Signed)
Per Dr. Benay Spice; notified pt that iron stores are lower-but still in normal range, hemoglobin stable, f/u as scheduled.  Pt verbalized understanding of all information.

## 2014-11-16 ENCOUNTER — Other Ambulatory Visit (HOSPITAL_COMMUNITY): Payer: Self-pay | Admitting: Internal Medicine

## 2014-11-16 DIAGNOSIS — R188 Other ascites: Secondary | ICD-10-CM | POA: Diagnosis not present

## 2014-11-16 DIAGNOSIS — D696 Thrombocytopenia, unspecified: Secondary | ICD-10-CM | POA: Diagnosis not present

## 2014-11-16 DIAGNOSIS — I8500 Esophageal varices without bleeding: Secondary | ICD-10-CM | POA: Diagnosis not present

## 2014-11-16 DIAGNOSIS — K7290 Hepatic failure, unspecified without coma: Secondary | ICD-10-CM | POA: Diagnosis not present

## 2014-11-16 DIAGNOSIS — Z86010 Personal history of colonic polyps: Secondary | ICD-10-CM | POA: Diagnosis not present

## 2014-11-16 DIAGNOSIS — K7460 Unspecified cirrhosis of liver: Secondary | ICD-10-CM

## 2014-11-18 DIAGNOSIS — I10 Essential (primary) hypertension: Secondary | ICD-10-CM | POA: Diagnosis not present

## 2014-11-18 DIAGNOSIS — K7290 Hepatic failure, unspecified without coma: Secondary | ICD-10-CM | POA: Diagnosis not present

## 2014-11-18 DIAGNOSIS — E1165 Type 2 diabetes mellitus with hyperglycemia: Secondary | ICD-10-CM | POA: Diagnosis not present

## 2014-11-18 DIAGNOSIS — I8500 Esophageal varices without bleeding: Secondary | ICD-10-CM | POA: Diagnosis not present

## 2014-11-18 DIAGNOSIS — F329 Major depressive disorder, single episode, unspecified: Secondary | ICD-10-CM | POA: Diagnosis not present

## 2014-11-18 DIAGNOSIS — E780 Pure hypercholesterolemia: Secondary | ICD-10-CM | POA: Diagnosis not present

## 2014-11-18 DIAGNOSIS — M899 Disorder of bone, unspecified: Secondary | ICD-10-CM | POA: Diagnosis not present

## 2014-11-18 DIAGNOSIS — D696 Thrombocytopenia, unspecified: Secondary | ICD-10-CM | POA: Diagnosis not present

## 2014-11-23 DIAGNOSIS — K7460 Unspecified cirrhosis of liver: Secondary | ICD-10-CM | POA: Diagnosis not present

## 2014-11-23 DIAGNOSIS — K7469 Other cirrhosis of liver: Secondary | ICD-10-CM | POA: Diagnosis not present

## 2014-11-23 DIAGNOSIS — Z7682 Awaiting organ transplant status: Secondary | ICD-10-CM | POA: Diagnosis not present

## 2014-11-23 DIAGNOSIS — K766 Portal hypertension: Secondary | ICD-10-CM | POA: Diagnosis not present

## 2014-11-23 DIAGNOSIS — K7581 Nonalcoholic steatohepatitis (NASH): Secondary | ICD-10-CM | POA: Diagnosis not present

## 2014-11-23 DIAGNOSIS — E559 Vitamin D deficiency, unspecified: Secondary | ICD-10-CM | POA: Diagnosis not present

## 2014-11-23 DIAGNOSIS — R188 Other ascites: Secondary | ICD-10-CM | POA: Diagnosis not present

## 2014-11-25 ENCOUNTER — Encounter (HOSPITAL_COMMUNITY)
Admission: RE | Admit: 2014-11-25 | Discharge: 2014-11-25 | Disposition: A | Discharge status: Home / Self Care | Payer: Medicare Other | Source: Ambulatory Visit | Attending: Internal Medicine | Admitting: Internal Medicine

## 2014-11-25 ENCOUNTER — Ambulatory Visit (HOSPITAL_COMMUNITY)
Admission: RE | Admit: 2014-11-25 | Discharge: 2014-11-25 | Disposition: A | Discharge status: Home / Self Care | Payer: Medicare Other | Source: Ambulatory Visit | Attending: Internal Medicine | Admitting: Internal Medicine

## 2014-11-25 DIAGNOSIS — R188 Other ascites: Secondary | ICD-10-CM | POA: Diagnosis not present

## 2014-11-25 DIAGNOSIS — K7460 Unspecified cirrhosis of liver: Secondary | ICD-10-CM

## 2014-11-25 MED ORDER — ALBUMIN HUMAN 25 % IV SOLN
50.0000 g | Freq: Once | INTRAVENOUS | Status: AC
  Administered 2014-11-25: 50 g via INTRAVENOUS
  Filled 2014-11-25: qty 200

## 2014-11-25 NOTE — Procedures (Signed)
Successful US guided paracentesis from RLQ.  Yielded 6 liters of serous fluid.  No immediate complications.  Pt tolerated well.  Post procedure IV Albumin was ordered.  Specimen was not sent for labs.  Tsosie Billing D PA-C 11/25/2014 11:50 AM

## 2014-11-30 ENCOUNTER — Encounter: Payer: Self-pay | Admitting: Oncology

## 2014-12-02 ENCOUNTER — Other Ambulatory Visit: Payer: Self-pay | Admitting: *Deleted

## 2014-12-06 DIAGNOSIS — I2510 Atherosclerotic heart disease of native coronary artery without angina pectoris: Secondary | ICD-10-CM | POA: Diagnosis not present

## 2014-12-06 DIAGNOSIS — Z01810 Encounter for preprocedural cardiovascular examination: Secondary | ICD-10-CM | POA: Diagnosis not present

## 2014-12-06 DIAGNOSIS — K766 Portal hypertension: Secondary | ICD-10-CM | POA: Diagnosis not present

## 2014-12-06 DIAGNOSIS — R188 Other ascites: Secondary | ICD-10-CM | POA: Diagnosis not present

## 2014-12-06 DIAGNOSIS — E119 Type 2 diabetes mellitus without complications: Secondary | ICD-10-CM | POA: Diagnosis not present

## 2014-12-06 DIAGNOSIS — K7460 Unspecified cirrhosis of liver: Secondary | ICD-10-CM | POA: Diagnosis not present

## 2014-12-06 DIAGNOSIS — D649 Anemia, unspecified: Secondary | ICD-10-CM | POA: Diagnosis not present

## 2014-12-06 DIAGNOSIS — I8510 Secondary esophageal varices without bleeding: Secondary | ICD-10-CM | POA: Diagnosis not present

## 2014-12-12 ENCOUNTER — Encounter: Payer: Self-pay | Admitting: Oncology

## 2014-12-12 ENCOUNTER — Other Ambulatory Visit: Payer: Self-pay | Admitting: *Deleted

## 2014-12-12 ENCOUNTER — Telehealth: Payer: Self-pay | Admitting: Oncology

## 2014-12-12 ENCOUNTER — Ambulatory Visit (INDEPENDENT_AMBULATORY_CARE_PROVIDER_SITE_OTHER): Payer: Medicare Other | Admitting: Ophthalmology

## 2014-12-12 DIAGNOSIS — H2513 Age-related nuclear cataract, bilateral: Secondary | ICD-10-CM

## 2014-12-12 DIAGNOSIS — E11319 Type 2 diabetes mellitus with unspecified diabetic retinopathy without macular edema: Secondary | ICD-10-CM

## 2014-12-12 DIAGNOSIS — E11329 Type 2 diabetes mellitus with mild nonproliferative diabetic retinopathy without macular edema: Secondary | ICD-10-CM

## 2014-12-12 DIAGNOSIS — H35033 Hypertensive retinopathy, bilateral: Secondary | ICD-10-CM | POA: Diagnosis not present

## 2014-12-12 DIAGNOSIS — H43813 Vitreous degeneration, bilateral: Secondary | ICD-10-CM | POA: Diagnosis not present

## 2014-12-12 DIAGNOSIS — I10 Essential (primary) hypertension: Secondary | ICD-10-CM

## 2014-12-12 NOTE — Telephone Encounter (Signed)
Spoke patient re iron inf appointments for 9/14 and 9/21.

## 2014-12-14 ENCOUNTER — Ambulatory Visit (HOSPITAL_BASED_OUTPATIENT_CLINIC_OR_DEPARTMENT_OTHER): Payer: Medicare Other

## 2014-12-14 VITALS — BP 109/63 | HR 69 | Temp 98.1°F | Resp 16

## 2014-12-14 DIAGNOSIS — D61818 Other pancytopenia: Secondary | ICD-10-CM

## 2014-12-14 DIAGNOSIS — D509 Iron deficiency anemia, unspecified: Secondary | ICD-10-CM

## 2014-12-14 MED ORDER — SODIUM CHLORIDE 0.9 % IV SOLN
Freq: Once | INTRAVENOUS | Status: AC
  Administered 2014-12-14: 14:00:00 via INTRAVENOUS

## 2014-12-14 MED ORDER — SODIUM CHLORIDE 0.9 % IV SOLN
510.0000 mg | Freq: Once | INTRAVENOUS | Status: AC
  Administered 2014-12-14: 510 mg via INTRAVENOUS
  Filled 2014-12-14: qty 17

## 2014-12-14 NOTE — Patient Instructions (Signed)

## 2014-12-21 ENCOUNTER — Ambulatory Visit (HOSPITAL_BASED_OUTPATIENT_CLINIC_OR_DEPARTMENT_OTHER): Payer: Medicare Other

## 2014-12-21 VITALS — BP 118/51 | HR 62 | Temp 98.2°F | Resp 16

## 2014-12-21 DIAGNOSIS — D509 Iron deficiency anemia, unspecified: Secondary | ICD-10-CM | POA: Diagnosis present

## 2014-12-21 MED ORDER — SODIUM CHLORIDE 0.9 % IV SOLN
510.0000 mg | Freq: Once | INTRAVENOUS | Status: AC
  Administered 2014-12-21: 510 mg via INTRAVENOUS
  Filled 2014-12-21: qty 17

## 2014-12-21 MED ORDER — SODIUM CHLORIDE 0.9 % IV SOLN
Freq: Once | INTRAVENOUS | Status: AC
  Administered 2014-12-21: 13:00:00 via INTRAVENOUS

## 2014-12-21 NOTE — Patient Instructions (Signed)

## 2014-12-30 DIAGNOSIS — K3189 Other diseases of stomach and duodenum: Secondary | ICD-10-CM | POA: Diagnosis not present

## 2014-12-30 DIAGNOSIS — E119 Type 2 diabetes mellitus without complications: Secondary | ICD-10-CM | POA: Diagnosis not present

## 2014-12-30 DIAGNOSIS — I8510 Secondary esophageal varices without bleeding: Secondary | ICD-10-CM | POA: Diagnosis not present

## 2014-12-30 DIAGNOSIS — I10 Essential (primary) hypertension: Secondary | ICD-10-CM | POA: Diagnosis not present

## 2014-12-30 DIAGNOSIS — Z87891 Personal history of nicotine dependence: Secondary | ICD-10-CM | POA: Diagnosis not present

## 2014-12-30 DIAGNOSIS — K31811 Angiodysplasia of stomach and duodenum with bleeding: Secondary | ICD-10-CM | POA: Diagnosis not present

## 2014-12-30 DIAGNOSIS — I491 Atrial premature depolarization: Secondary | ICD-10-CM | POA: Diagnosis not present

## 2014-12-30 DIAGNOSIS — K766 Portal hypertension: Secondary | ICD-10-CM | POA: Diagnosis not present

## 2014-12-30 DIAGNOSIS — K7460 Unspecified cirrhosis of liver: Secondary | ICD-10-CM | POA: Diagnosis not present

## 2014-12-30 DIAGNOSIS — I8500 Esophageal varices without bleeding: Secondary | ICD-10-CM | POA: Diagnosis not present

## 2014-12-30 DIAGNOSIS — Z794 Long term (current) use of insulin: Secondary | ICD-10-CM | POA: Diagnosis not present

## 2015-01-23 ENCOUNTER — Telehealth: Payer: Self-pay | Admitting: Nurse Practitioner

## 2015-01-23 ENCOUNTER — Ambulatory Visit (HOSPITAL_BASED_OUTPATIENT_CLINIC_OR_DEPARTMENT_OTHER): Payer: Medicare Other | Admitting: Nurse Practitioner

## 2015-01-23 ENCOUNTER — Other Ambulatory Visit (HOSPITAL_BASED_OUTPATIENT_CLINIC_OR_DEPARTMENT_OTHER): Payer: Medicare Other

## 2015-01-23 VITALS — BP 115/50 | HR 63 | Temp 98.6°F | Resp 18 | Ht 65.5 in | Wt 164.9 lb

## 2015-01-23 DIAGNOSIS — K7581 Nonalcoholic steatohepatitis (NASH): Secondary | ICD-10-CM

## 2015-01-23 DIAGNOSIS — Z862 Personal history of diseases of the blood and blood-forming organs and certain disorders involving the immune mechanism: Secondary | ICD-10-CM | POA: Diagnosis not present

## 2015-01-23 DIAGNOSIS — K7460 Unspecified cirrhosis of liver: Secondary | ICD-10-CM

## 2015-01-23 DIAGNOSIS — D61818 Other pancytopenia: Secondary | ICD-10-CM

## 2015-01-23 DIAGNOSIS — I8510 Secondary esophageal varices without bleeding: Secondary | ICD-10-CM

## 2015-01-23 DIAGNOSIS — D509 Iron deficiency anemia, unspecified: Secondary | ICD-10-CM

## 2015-01-23 LAB — CBC WITH DIFFERENTIAL/PLATELET
BASO%: 1.1 % (ref 0.0–2.0)
BASOS ABS: 0 10*3/uL (ref 0.0–0.1)
EOS%: 3.6 % (ref 0.0–7.0)
Eosinophils Absolute: 0.1 10*3/uL (ref 0.0–0.5)
HEMATOCRIT: 29.7 % — AB (ref 34.8–46.6)
HGB: 9.9 g/dL — ABNORMAL LOW (ref 11.6–15.9)
LYMPH#: 0.2 10*3/uL — AB (ref 0.9–3.3)
LYMPH%: 11.5 % — ABNORMAL LOW (ref 14.0–49.7)
MCH: 29.6 pg (ref 25.1–34.0)
MCHC: 33.2 g/dL (ref 31.5–36.0)
MCV: 89 fL (ref 79.5–101.0)
MONO#: 0.2 10*3/uL (ref 0.1–0.9)
MONO%: 7.4 % (ref 0.0–14.0)
NEUT#: 1.6 10*3/uL (ref 1.5–6.5)
NEUT%: 76.4 % (ref 38.4–76.8)
PLATELETS: 85 10*3/uL — AB (ref 145–400)
RBC: 3.34 10*6/uL — ABNORMAL LOW (ref 3.70–5.45)
RDW: 18.6 % — ABNORMAL HIGH (ref 11.2–14.5)
WBC: 2.1 10*3/uL — ABNORMAL LOW (ref 3.9–10.3)

## 2015-01-23 LAB — FERRITIN CHCC: Ferritin: 73 ng/ml (ref 9–269)

## 2015-01-23 NOTE — Telephone Encounter (Signed)
per pof to sch pt appt-gave pt copy of avs °

## 2015-01-23 NOTE — Progress Notes (Signed)
  [place] OFFICE PROGRESS NOTE   Diagnosis:   Cirrhosis, history of iron deficiency  INTERVAL HISTORY:   Jillian Sanders returns as scheduled. She last received IV iron in September of this year. She reports her ferritin was around 20. She has felt better since receiving the IV iron. She is not aware of any bleeding. She continues to undergo intermittent paracentesis procedures.  Objective:  Vital signs in last 24 hours:  Blood pressure 115/50, pulse 63, temperature 98.6 F (37 C), temperature source Oral, resp. rate 18, height 5' 5.5" (1.664 m), weight 164 lb 14.4 oz (74.798 kg), SpO2 100 %.    HEENT:  No thrush or ulcers. Lymphatics:  No palpable cervical or supraclavicular lymph nodes. Resp:  Lungs clear bilaterally. Cardio:  Regular rate and rhythm. GI:  Abdomen is distended with ascites. Spleen tip palpable left upper abdomen. Vascular:  No leg edema.   Lab Results:  Lab Results  Component Value Date   WBC 2.1* 01/23/2015   HGB 9.9* 01/23/2015   HCT 29.7* 01/23/2015   MCV 89.0 01/23/2015   PLT 85* 01/23/2015   NEUTROABS 1.6 01/23/2015    Imaging:  No results found.  Medications: I have reviewed the patient's current medications.  Assessment/Plan: 1. NASH.  2. Cirrhosis secondary to #1.  3. Splenomegaly secondary to #1.  4. Chronic pancytopenia secondary to #2 and #3. 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 She is maintained on intermittent IV iron therapy. 5. ? Abdominal hernia 08/10/2013. She reported an abdominal MRI was scheduled at [facility].  6. ? Small right axillary lymph node 08/10/2013. No adenopathy noted on exam 11/30/2013. 7. Exploratory laparotomy with omentum and peritoneal biopsies at [facility] 03/02/2014 with no malignancy identified   Disposition: Jillian Sanders appears stable from a hematologic standpoint. We will follow-up on the ferritin from today. The plan is to continue IV iron as needed. We  will continue labs every 3 months. She will return for an office visit in 9 months. She understands to contact the office between visits with any problems.    Ned Card ANP/GNP-BC   01/23/2015  12:47 PM

## 2015-01-24 ENCOUNTER — Telehealth: Payer: Self-pay | Admitting: Nurse Practitioner

## 2015-01-24 NOTE — Telephone Encounter (Signed)
-----   Message from Owens Shark, NP sent at 01/23/2015  4:13 PM EDT ----- Please let her know ferritin level is in normal range at 73. Thanks

## 2015-01-24 NOTE — Telephone Encounter (Signed)
As per Owens Shark call placed to Jillian Sanders to inform her that Ferritin level was in normal range at 73. Pt. Verbalized understanding.

## 2015-01-25 DIAGNOSIS — K7460 Unspecified cirrhosis of liver: Secondary | ICD-10-CM | POA: Diagnosis not present

## 2015-01-25 DIAGNOSIS — I81 Portal vein thrombosis: Secondary | ICD-10-CM | POA: Diagnosis not present

## 2015-01-25 DIAGNOSIS — K766 Portal hypertension: Secondary | ICD-10-CM | POA: Diagnosis not present

## 2015-01-25 DIAGNOSIS — R160 Hepatomegaly, not elsewhere classified: Secondary | ICD-10-CM | POA: Diagnosis not present

## 2015-01-25 DIAGNOSIS — K7469 Other cirrhosis of liver: Secondary | ICD-10-CM | POA: Diagnosis not present

## 2015-01-25 DIAGNOSIS — Z23 Encounter for immunization: Secondary | ICD-10-CM | POA: Diagnosis not present

## 2015-01-25 DIAGNOSIS — R161 Splenomegaly, not elsewhere classified: Secondary | ICD-10-CM | POA: Diagnosis not present

## 2015-01-25 DIAGNOSIS — R188 Other ascites: Secondary | ICD-10-CM | POA: Diagnosis not present

## 2015-01-25 DIAGNOSIS — I10 Essential (primary) hypertension: Secondary | ICD-10-CM | POA: Diagnosis not present

## 2015-01-26 ENCOUNTER — Other Ambulatory Visit: Payer: Medicare Other

## 2015-01-26 ENCOUNTER — Ambulatory Visit: Payer: Medicare Other | Admitting: Nurse Practitioner

## 2015-02-02 IMAGING — US US PARACENTESIS
1 series · 8 of 8 positions shown · non-contrast
Comparison: 08/02/2013 paracentesis.

MEDICATIONS:
None.

COMPLICATIONS:
None immediate

INDICATION: History of cirrhosis, recurrent ascites, request for paracentesis.

EXAM:
ULTRASOUND-GUIDED PARACENTESIS
TECHNIQUE: Informed written consent was obtained from the patient after a
discussion of the risks, benefits and alternatives to treatment. A
timeout was performed prior to the initiation of the procedure.

[Series 1: us paracentesis · 0.27mm/px · 8 of 8 slices shown]
[im 1/8]
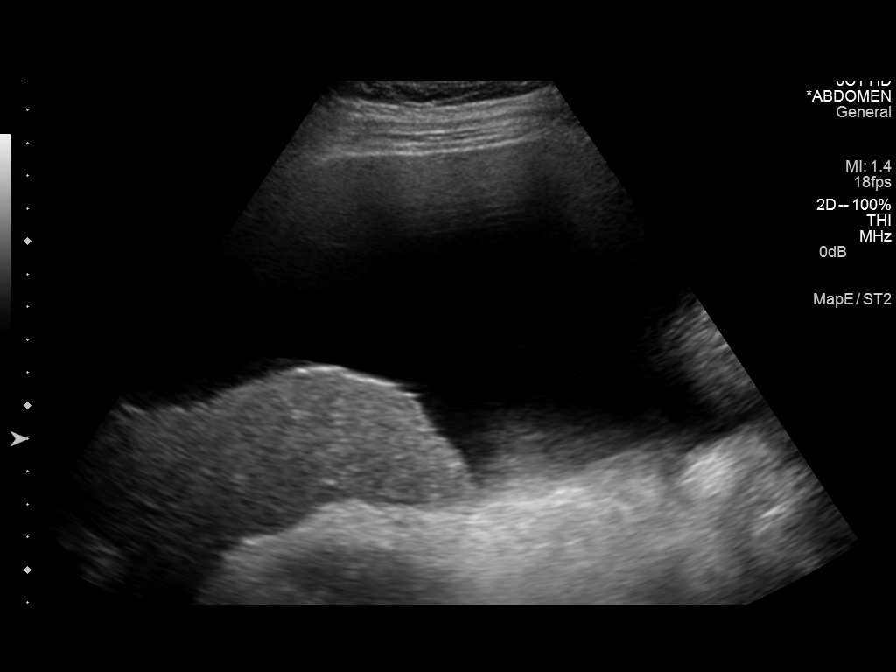
[im 2/8]
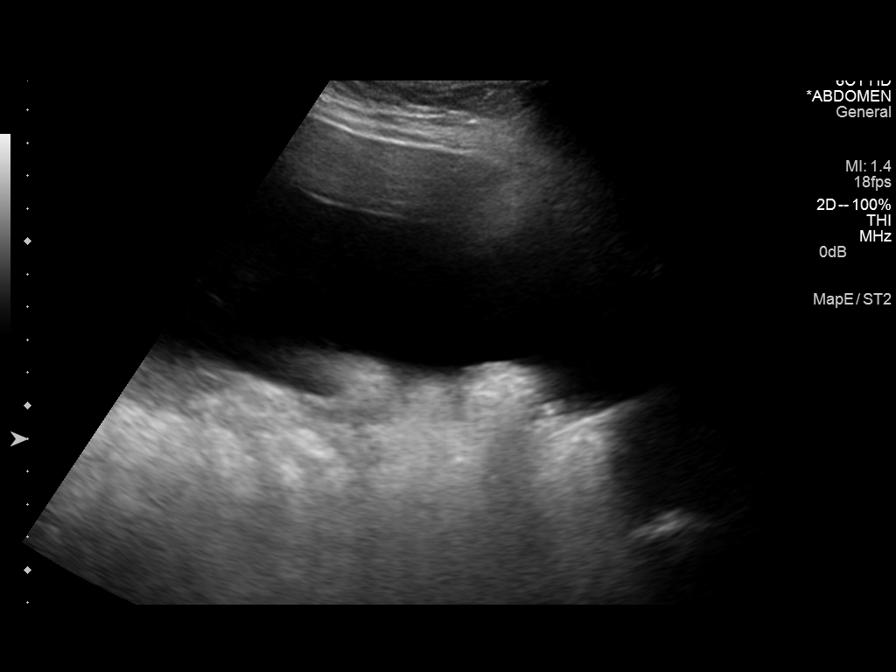
[im 3/8]
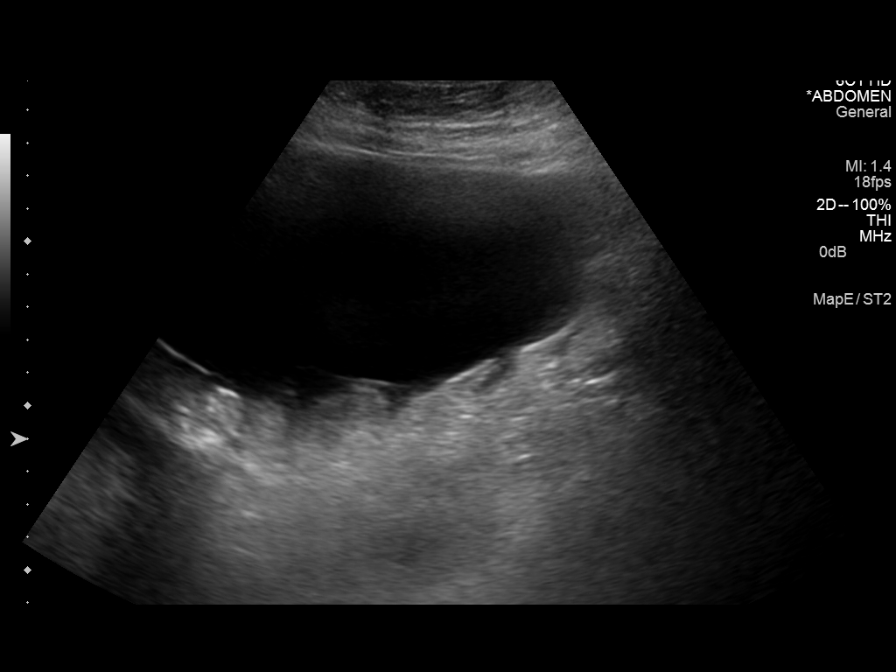
[im 4/8]
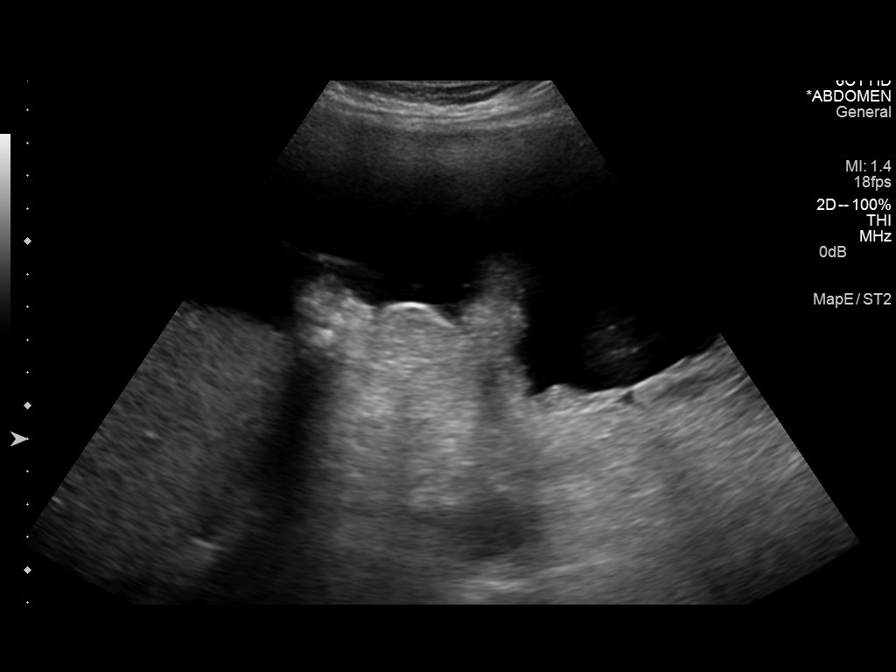
[im 5/8]
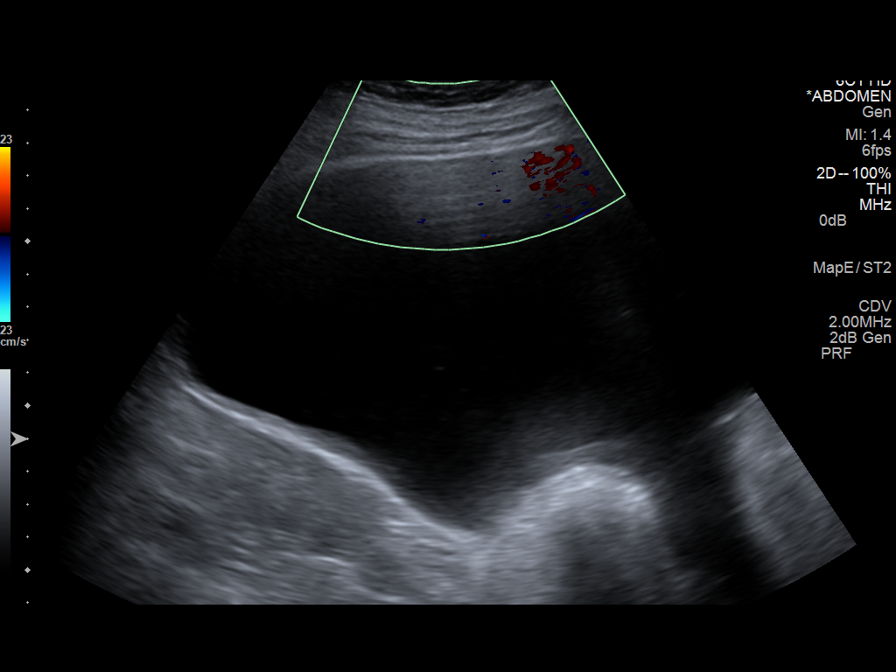
[im 6/8]
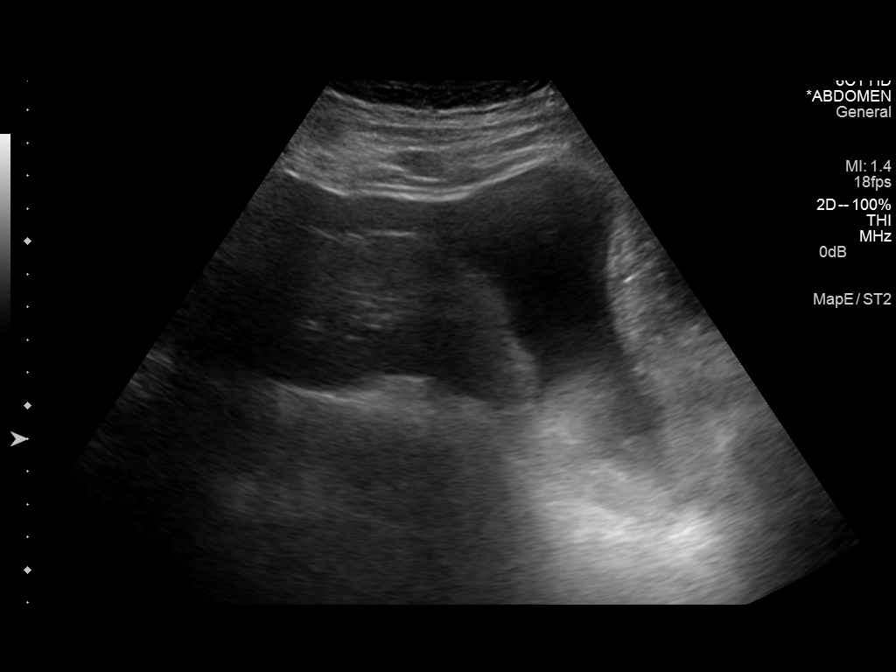
[im 7/8]
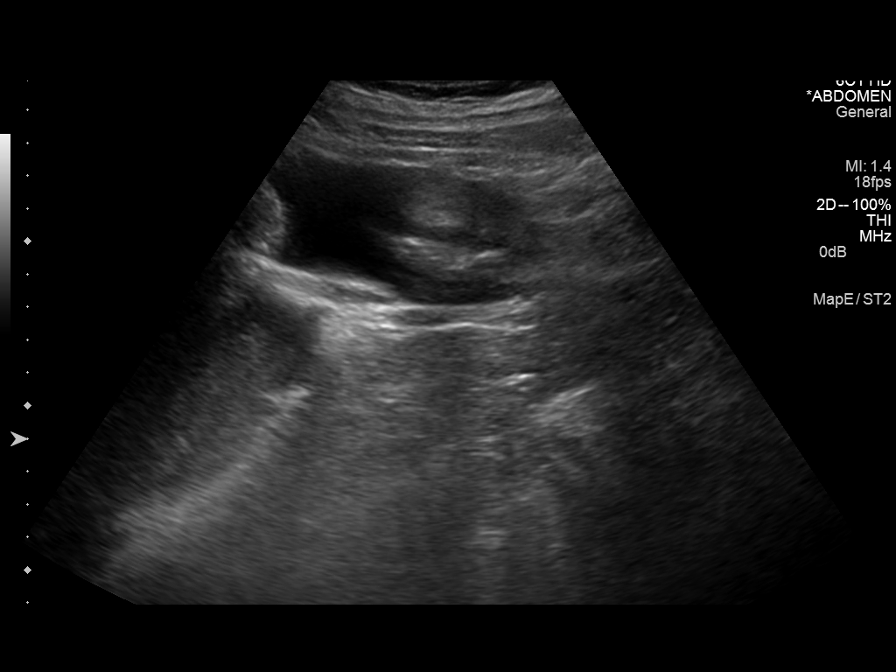
[im 8/8]
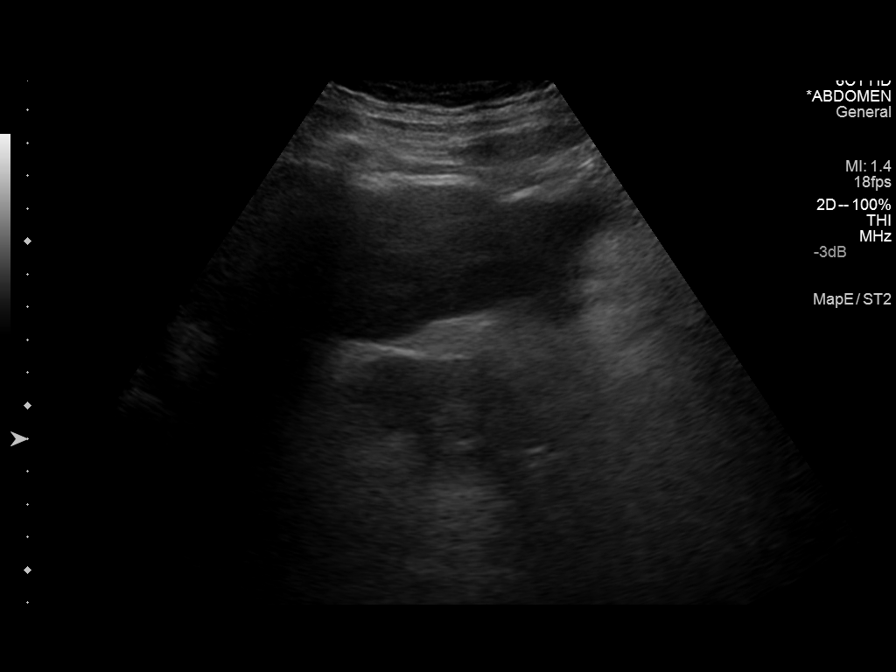

[8 of 8 positions shown; findings below may reference images not displayed]

Initial ultrasound scanning demonstrates a large amount of ascites
within the right upper abdominal quadrant. The right upper abdomen
was prepped and draped in the usual sterile fashion. 1% lidocaine
was used for local anesthesia. Under direct ultrasound guidance, a
19 gauge, 7-cm, Yueh catheter was introduced. An ultrasound image
was saved for documentation purposed. The paracentesis was
performed. The catheter was removed and a dressing was applied. The
patient tolerated the procedure well without immediate post
procedural complication.
FINDINGS: A total of approximately 6.8 liters of serous fluid was removed.
IMPRESSION: Successful ultrasound-guided paracentesis yielding 6.8 liters of
peritoneal fluid.

## 2015-02-07 ENCOUNTER — Other Ambulatory Visit (HOSPITAL_COMMUNITY): Payer: Self-pay | Admitting: Internal Medicine

## 2015-02-07 ENCOUNTER — Other Ambulatory Visit: Payer: Self-pay | Admitting: Internal Medicine

## 2015-02-07 DIAGNOSIS — R188 Other ascites: Secondary | ICD-10-CM

## 2015-02-07 DIAGNOSIS — K3189 Other diseases of stomach and duodenum: Secondary | ICD-10-CM | POA: Diagnosis not present

## 2015-02-07 DIAGNOSIS — K7581 Nonalcoholic steatohepatitis (NASH): Secondary | ICD-10-CM | POA: Diagnosis not present

## 2015-02-07 DIAGNOSIS — L905 Scar conditions and fibrosis of skin: Secondary | ICD-10-CM | POA: Diagnosis not present

## 2015-02-07 DIAGNOSIS — K2971 Gastritis, unspecified, with bleeding: Secondary | ICD-10-CM | POA: Diagnosis not present

## 2015-02-07 DIAGNOSIS — F329 Major depressive disorder, single episode, unspecified: Secondary | ICD-10-CM | POA: Diagnosis not present

## 2015-02-07 DIAGNOSIS — Z794 Long term (current) use of insulin: Secondary | ICD-10-CM | POA: Diagnosis not present

## 2015-02-07 DIAGNOSIS — I8500 Esophageal varices without bleeding: Secondary | ICD-10-CM | POA: Diagnosis not present

## 2015-02-07 DIAGNOSIS — E7800 Pure hypercholesterolemia, unspecified: Secondary | ICD-10-CM | POA: Diagnosis not present

## 2015-02-07 DIAGNOSIS — K259 Gastric ulcer, unspecified as acute or chronic, without hemorrhage or perforation: Secondary | ICD-10-CM | POA: Diagnosis not present

## 2015-02-07 DIAGNOSIS — I10 Essential (primary) hypertension: Secondary | ICD-10-CM | POA: Diagnosis not present

## 2015-02-07 DIAGNOSIS — D649 Anemia, unspecified: Secondary | ICD-10-CM | POA: Diagnosis not present

## 2015-02-07 DIAGNOSIS — Z87891 Personal history of nicotine dependence: Secondary | ICD-10-CM | POA: Diagnosis not present

## 2015-02-07 DIAGNOSIS — K31819 Angiodysplasia of stomach and duodenum without bleeding: Secondary | ICD-10-CM | POA: Diagnosis not present

## 2015-02-07 DIAGNOSIS — M1990 Unspecified osteoarthritis, unspecified site: Secondary | ICD-10-CM | POA: Diagnosis not present

## 2015-02-07 DIAGNOSIS — E119 Type 2 diabetes mellitus without complications: Secondary | ICD-10-CM | POA: Diagnosis not present

## 2015-02-07 DIAGNOSIS — K7460 Unspecified cirrhosis of liver: Secondary | ICD-10-CM

## 2015-02-07 DIAGNOSIS — Z79899 Other long term (current) drug therapy: Secondary | ICD-10-CM | POA: Diagnosis not present

## 2015-02-09 DIAGNOSIS — K7460 Unspecified cirrhosis of liver: Secondary | ICD-10-CM | POA: Diagnosis not present

## 2015-02-13 ENCOUNTER — Ambulatory Visit (HOSPITAL_COMMUNITY)
Admission: RE | Admit: 2015-02-13 | Discharge: 2015-02-13 | Disposition: A | Discharge status: Home / Self Care | Payer: Medicare Other | Source: Ambulatory Visit | Attending: Internal Medicine | Admitting: Internal Medicine

## 2015-02-13 DIAGNOSIS — K7460 Unspecified cirrhosis of liver: Secondary | ICD-10-CM | POA: Diagnosis not present

## 2015-02-13 DIAGNOSIS — R188 Other ascites: Secondary | ICD-10-CM

## 2015-02-13 NOTE — Procedures (Signed)
Successful US guided paracentesis from LLQ.  Yielded 3 liters of cloudy yellow fluid.  No immediate complications.  Pt tolerated well.   Specimen was not sent for labs.  Tsosie Billing D PA-C 02/13/2015 3:00 PM

## 2015-02-15 DIAGNOSIS — Z7682 Awaiting organ transplant status: Secondary | ICD-10-CM | POA: Diagnosis not present

## 2015-02-15 DIAGNOSIS — E089 Diabetes mellitus due to underlying condition without complications: Secondary | ICD-10-CM | POA: Diagnosis not present

## 2015-02-15 DIAGNOSIS — K7460 Unspecified cirrhosis of liver: Secondary | ICD-10-CM | POA: Diagnosis not present

## 2015-02-15 DIAGNOSIS — R188 Other ascites: Secondary | ICD-10-CM | POA: Diagnosis not present

## 2015-02-15 DIAGNOSIS — K766 Portal hypertension: Secondary | ICD-10-CM | POA: Diagnosis not present

## 2015-02-15 DIAGNOSIS — I10 Essential (primary) hypertension: Secondary | ICD-10-CM | POA: Diagnosis not present

## 2015-03-17 DIAGNOSIS — R188 Other ascites: Secondary | ICD-10-CM | POA: Diagnosis not present

## 2015-03-17 DIAGNOSIS — F329 Major depressive disorder, single episode, unspecified: Secondary | ICD-10-CM | POA: Diagnosis not present

## 2015-03-17 DIAGNOSIS — E7800 Pure hypercholesterolemia, unspecified: Secondary | ICD-10-CM | POA: Diagnosis not present

## 2015-03-17 DIAGNOSIS — I8500 Esophageal varices without bleeding: Secondary | ICD-10-CM | POA: Diagnosis not present

## 2015-03-17 DIAGNOSIS — D696 Thrombocytopenia, unspecified: Secondary | ICD-10-CM | POA: Diagnosis not present

## 2015-03-17 DIAGNOSIS — K7290 Hepatic failure, unspecified without coma: Secondary | ICD-10-CM | POA: Diagnosis not present

## 2015-03-17 DIAGNOSIS — I10 Essential (primary) hypertension: Secondary | ICD-10-CM | POA: Diagnosis not present

## 2015-03-17 DIAGNOSIS — E1165 Type 2 diabetes mellitus with hyperglycemia: Secondary | ICD-10-CM | POA: Diagnosis not present

## 2015-04-22 DIAGNOSIS — M810 Age-related osteoporosis without current pathological fracture: Secondary | ICD-10-CM | POA: Diagnosis present

## 2015-04-22 DIAGNOSIS — R1084 Generalized abdominal pain: Secondary | ICD-10-CM | POA: Diagnosis not present

## 2015-04-22 DIAGNOSIS — K429 Umbilical hernia without obstruction or gangrene: Secondary | ICD-10-CM | POA: Diagnosis not present

## 2015-04-22 DIAGNOSIS — D509 Iron deficiency anemia, unspecified: Secondary | ICD-10-CM | POA: Diagnosis present

## 2015-04-22 DIAGNOSIS — I8510 Secondary esophageal varices without bleeding: Secondary | ICD-10-CM | POA: Diagnosis present

## 2015-04-22 DIAGNOSIS — Z7682 Awaiting organ transplant status: Secondary | ICD-10-CM | POA: Diagnosis not present

## 2015-04-22 DIAGNOSIS — Z794 Long term (current) use of insulin: Secondary | ICD-10-CM | POA: Diagnosis not present

## 2015-04-22 DIAGNOSIS — Z90710 Acquired absence of both cervix and uterus: Secondary | ICD-10-CM | POA: Diagnosis not present

## 2015-04-22 DIAGNOSIS — K7460 Unspecified cirrhosis of liver: Secondary | ICD-10-CM | POA: Diagnosis not present

## 2015-04-22 DIAGNOSIS — D6959 Other secondary thrombocytopenia: Secondary | ICD-10-CM | POA: Diagnosis present

## 2015-04-22 DIAGNOSIS — K219 Gastro-esophageal reflux disease without esophagitis: Secondary | ICD-10-CM | POA: Diagnosis present

## 2015-04-22 DIAGNOSIS — I10 Essential (primary) hypertension: Secondary | ICD-10-CM | POA: Diagnosis present

## 2015-04-22 DIAGNOSIS — K7581 Nonalcoholic steatohepatitis (NASH): Secondary | ICD-10-CM | POA: Diagnosis present

## 2015-04-22 DIAGNOSIS — F329 Major depressive disorder, single episode, unspecified: Secondary | ICD-10-CM | POA: Diagnosis present

## 2015-04-22 DIAGNOSIS — K766 Portal hypertension: Secondary | ICD-10-CM | POA: Diagnosis not present

## 2015-04-22 DIAGNOSIS — K652 Spontaneous bacterial peritonitis: Secondary | ICD-10-CM | POA: Diagnosis present

## 2015-04-22 DIAGNOSIS — E7800 Pure hypercholesterolemia, unspecified: Secondary | ICD-10-CM | POA: Diagnosis present

## 2015-04-22 DIAGNOSIS — D759 Disease of blood and blood-forming organs, unspecified: Secondary | ICD-10-CM | POA: Diagnosis not present

## 2015-04-22 DIAGNOSIS — K7290 Hepatic failure, unspecified without coma: Secondary | ICD-10-CM | POA: Diagnosis not present

## 2015-04-22 DIAGNOSIS — E119 Type 2 diabetes mellitus without complications: Secondary | ICD-10-CM | POA: Diagnosis present

## 2015-04-22 DIAGNOSIS — E785 Hyperlipidemia, unspecified: Secondary | ICD-10-CM | POA: Diagnosis present

## 2015-04-22 DIAGNOSIS — R1030 Lower abdominal pain, unspecified: Secondary | ICD-10-CM | POA: Diagnosis present

## 2015-04-22 DIAGNOSIS — I350 Nonrheumatic aortic (valve) stenosis: Secondary | ICD-10-CM | POA: Diagnosis present

## 2015-04-22 DIAGNOSIS — Z9049 Acquired absence of other specified parts of digestive tract: Secondary | ICD-10-CM | POA: Diagnosis not present

## 2015-04-22 DIAGNOSIS — Z87891 Personal history of nicotine dependence: Secondary | ICD-10-CM | POA: Diagnosis not present

## 2015-04-22 DIAGNOSIS — D61818 Other pancytopenia: Secondary | ICD-10-CM | POA: Diagnosis present

## 2015-04-22 DIAGNOSIS — R188 Other ascites: Secondary | ICD-10-CM | POA: Diagnosis not present

## 2015-04-22 DIAGNOSIS — D638 Anemia in other chronic diseases classified elsewhere: Secondary | ICD-10-CM | POA: Diagnosis not present

## 2015-04-22 DIAGNOSIS — E871 Hypo-osmolality and hyponatremia: Secondary | ICD-10-CM | POA: Diagnosis present

## 2015-04-22 DIAGNOSIS — R262 Difficulty in walking, not elsewhere classified: Secondary | ICD-10-CM | POA: Diagnosis not present

## 2015-04-25 ENCOUNTER — Other Ambulatory Visit: Payer: Medicare Other

## 2015-05-01 IMAGING — US US PARACENTESIS
1 series · 5 of 5 positions shown · non-contrast
Comparison: Prior paracentesis on 05/20/14

MEDICATIONS:
None.

COMPLICATIONS:
None immediate

INDICATION: Cirrhosis, recurrent ascites. Request is made for therapeutic
paracentesis.

EXAM:
ULTRASOUND-GUIDED THERAPEUTIC  PARACENTESIS
TECHNIQUE: Informed written consent was obtained from the patient after a
discussion of the risks, benefits and alternatives to treatment. A
timeout was performed prior to the initiation of the procedure.

[Series 1: us paracentesis · 0.27mm/px · 5 of 5 slices shown]
[im 1/5]
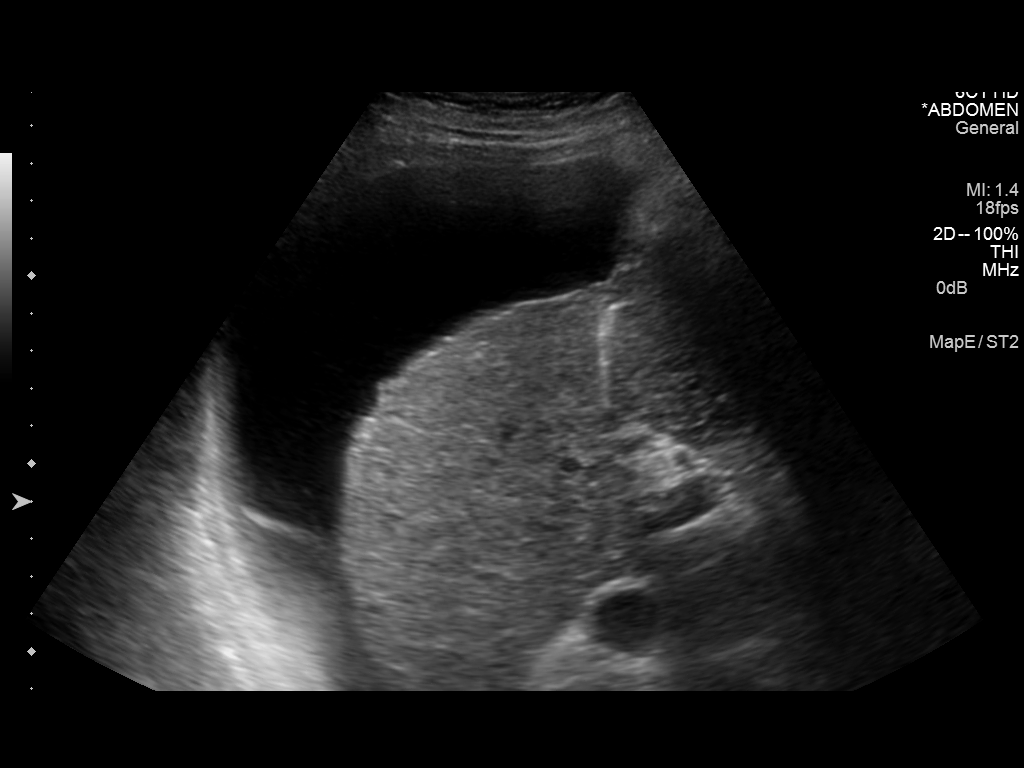
[im 2/5]
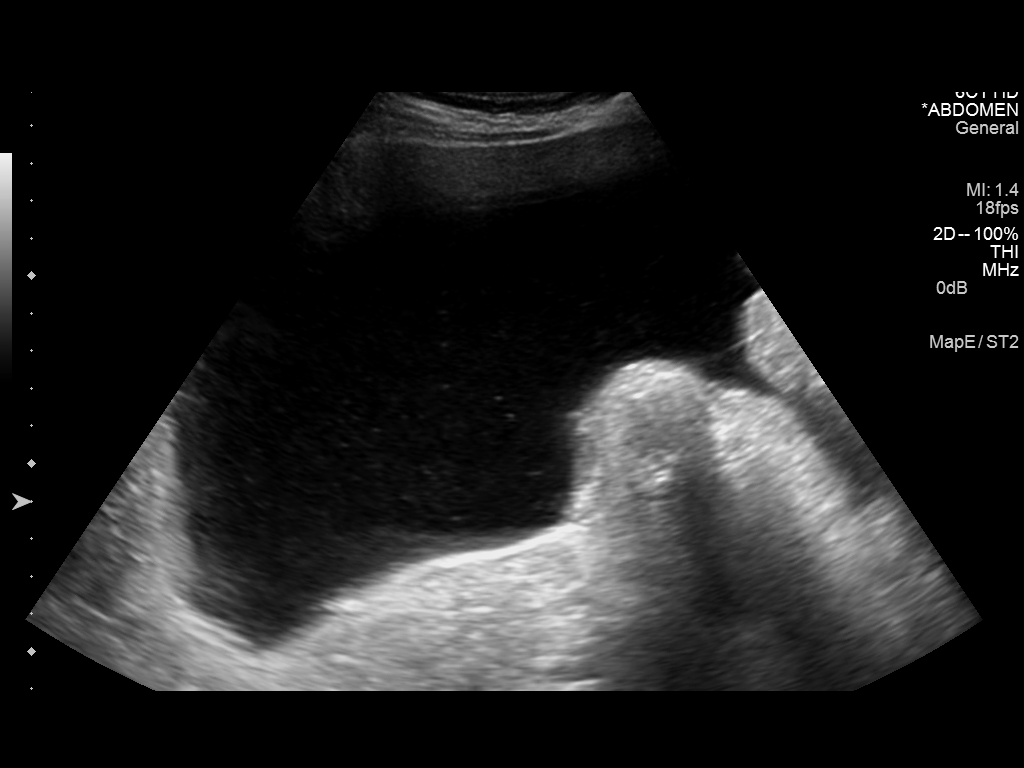
[im 3/5]
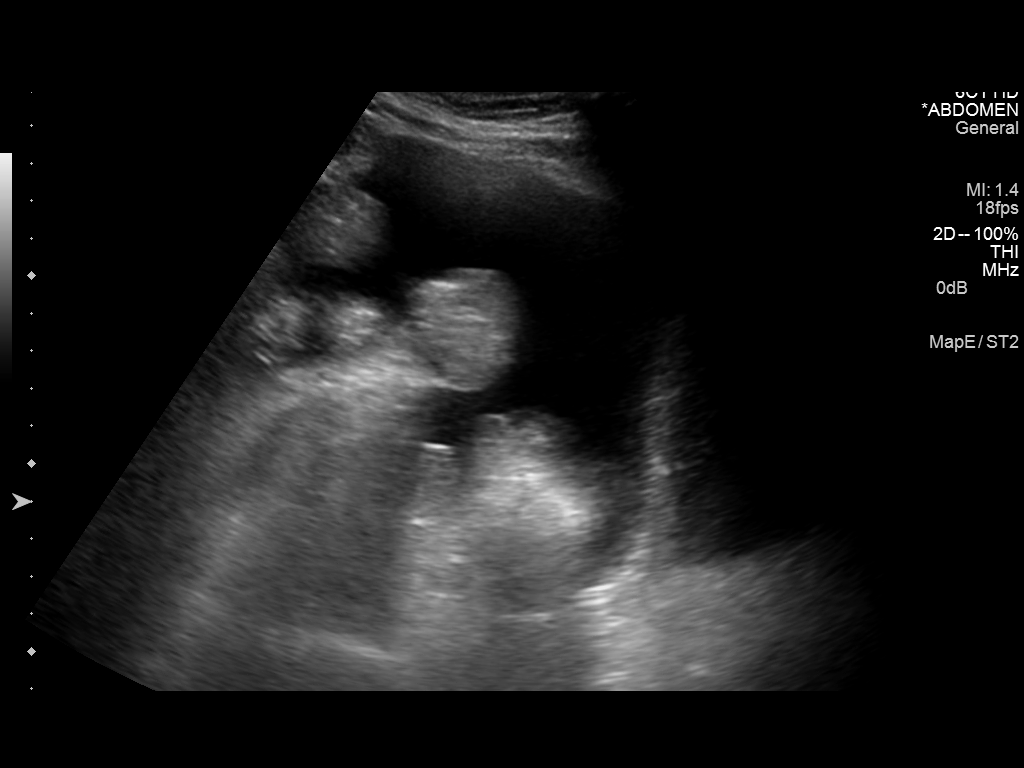
[im 4/5]
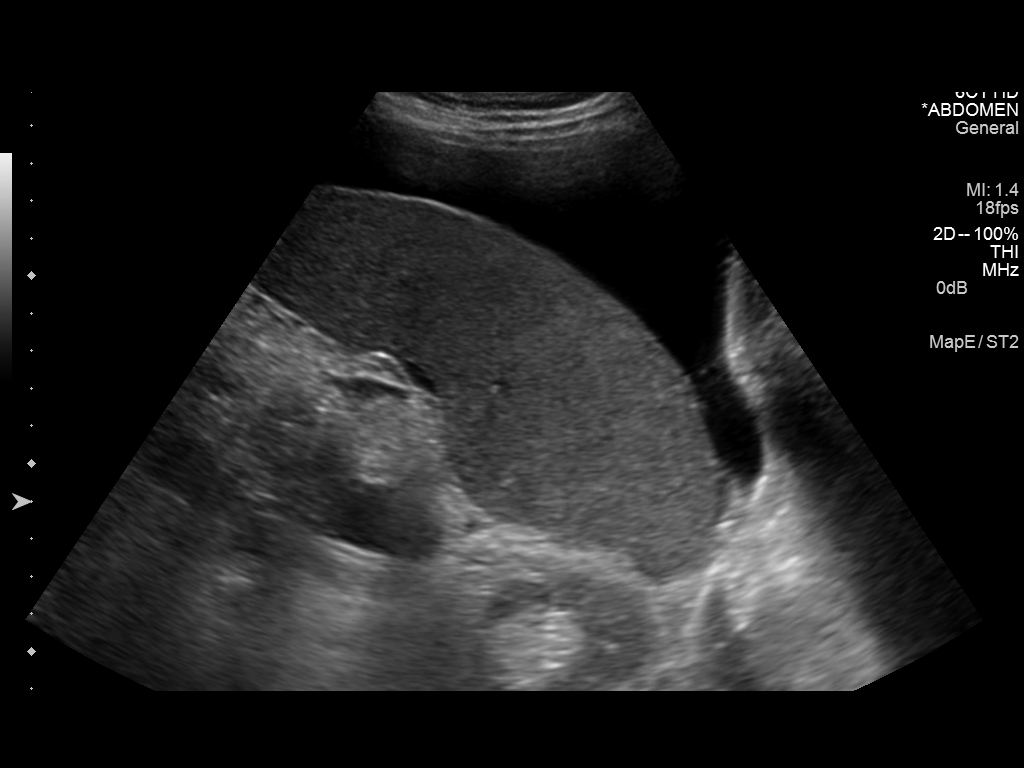
[im 5/5]
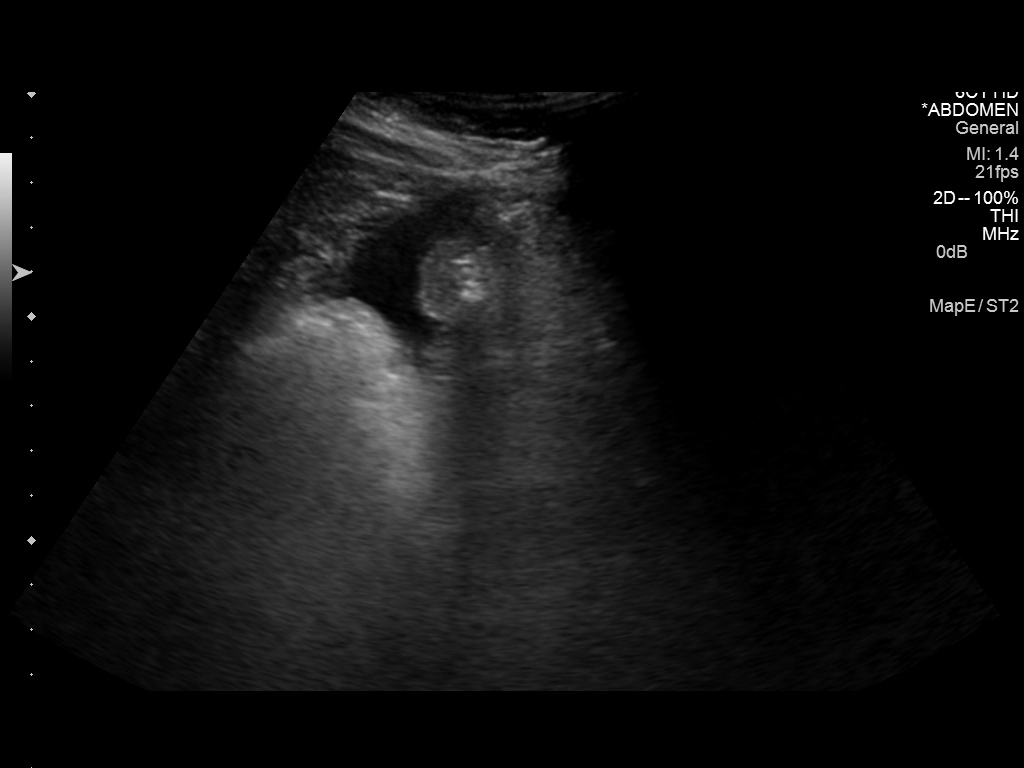

[5 of 5 positions shown; findings below may reference images not displayed]

Initial ultrasound scanning demonstrates a moderate to large amount
of ascites within the right lower abdominal quadrant. The right
lower abdomen was prepped and draped in the usual sterile fashion.
1% lidocaine was used for local anesthesia. Under direct ultrasound
guidance, a 19 gauge, 10-cm, Yueh catheter was introduced. An
ultrasound image was saved for documentation purposed. The
paracentesis was performed. The catheter was removed and a dressing
was applied. The patient tolerated the procedure well without
immediate post procedural complication.
FINDINGS: A total of approximately 4.5 liters of yellow fluid was removed.
IMPRESSION: Successful ultrasound-guided therapeutic paracentesis yielding
liters of peritoneal fluid.

## 2015-05-03 DIAGNOSIS — R188 Other ascites: Secondary | ICD-10-CM | POA: Diagnosis not present

## 2015-05-03 DIAGNOSIS — F330 Major depressive disorder, recurrent, mild: Secondary | ICD-10-CM | POA: Diagnosis not present

## 2015-05-03 DIAGNOSIS — K769 Liver disease, unspecified: Secondary | ICD-10-CM | POA: Diagnosis not present

## 2015-05-03 DIAGNOSIS — K7460 Unspecified cirrhosis of liver: Secondary | ICD-10-CM | POA: Diagnosis not present

## 2015-05-03 DIAGNOSIS — K766 Portal hypertension: Secondary | ICD-10-CM | POA: Diagnosis not present

## 2015-05-03 DIAGNOSIS — R768 Other specified abnormal immunological findings in serum: Secondary | ICD-10-CM | POA: Diagnosis not present

## 2015-05-31 DIAGNOSIS — N181 Chronic kidney disease, stage 1: Secondary | ICD-10-CM | POA: Diagnosis not present

## 2015-05-31 DIAGNOSIS — Z7682 Awaiting organ transplant status: Secondary | ICD-10-CM | POA: Diagnosis not present

## 2015-05-31 DIAGNOSIS — K766 Portal hypertension: Secondary | ICD-10-CM | POA: Diagnosis not present

## 2015-05-31 DIAGNOSIS — R188 Other ascites: Secondary | ICD-10-CM | POA: Diagnosis not present

## 2015-05-31 DIAGNOSIS — K652 Spontaneous bacterial peritonitis: Secondary | ICD-10-CM | POA: Diagnosis not present

## 2015-05-31 DIAGNOSIS — D638 Anemia in other chronic diseases classified elsewhere: Secondary | ICD-10-CM | POA: Diagnosis not present

## 2015-05-31 DIAGNOSIS — K7460 Unspecified cirrhosis of liver: Secondary | ICD-10-CM | POA: Diagnosis not present

## 2015-05-31 DIAGNOSIS — I10 Essential (primary) hypertension: Secondary | ICD-10-CM | POA: Diagnosis not present

## 2015-05-31 DIAGNOSIS — E0822 Diabetes mellitus due to underlying condition with diabetic chronic kidney disease: Secondary | ICD-10-CM | POA: Diagnosis not present

## 2015-05-31 DIAGNOSIS — K432 Incisional hernia without obstruction or gangrene: Secondary | ICD-10-CM | POA: Diagnosis not present

## 2015-06-06 ENCOUNTER — Encounter: Payer: Self-pay | Admitting: Oncology

## 2015-06-07 ENCOUNTER — Other Ambulatory Visit: Payer: Self-pay | Admitting: *Deleted

## 2015-06-07 ENCOUNTER — Telehealth: Payer: Self-pay | Admitting: *Deleted

## 2015-06-07 NOTE — Telephone Encounter (Signed)
TC from patient stating that she had a Ferritin level done @ [facility] recently (this week) and it is 19.  She is requesting an appt for Iron infusion as soon as possible.  Currently, she has a lab appt only on 07/24/15 and does not see Dr. Benay Spice until July 2017

## 2015-06-08 ENCOUNTER — Telehealth: Payer: Self-pay | Admitting: Oncology

## 2015-06-08 ENCOUNTER — Telehealth: Payer: Self-pay | Admitting: *Deleted

## 2015-06-08 NOTE — Telephone Encounter (Signed)
per pof to sch pt fera-sent email to MW to sch-willc all pt after reply

## 2015-06-08 NOTE — Telephone Encounter (Signed)
Per staff message and POF I have scheduled appts. Advised scheduler of appts. JMW  

## 2015-06-12 ENCOUNTER — Other Ambulatory Visit: Payer: Self-pay | Admitting: Nurse Practitioner

## 2015-06-12 ENCOUNTER — Ambulatory Visit (HOSPITAL_BASED_OUTPATIENT_CLINIC_OR_DEPARTMENT_OTHER): Payer: Managed Care, Other (non HMO)

## 2015-06-12 ENCOUNTER — Other Ambulatory Visit: Payer: Self-pay | Admitting: *Deleted

## 2015-06-12 ENCOUNTER — Telehealth: Payer: Self-pay | Admitting: Oncology

## 2015-06-12 VITALS — BP 114/43 | HR 71 | Temp 97.2°F | Resp 18

## 2015-06-12 DIAGNOSIS — D509 Iron deficiency anemia, unspecified: Secondary | ICD-10-CM

## 2015-06-12 MED ORDER — SODIUM CHLORIDE 0.9 % IV SOLN
510.0000 mg | Freq: Once | INTRAVENOUS | Status: AC
  Administered 2015-06-12: 510 mg via INTRAVENOUS
  Filled 2015-06-12: qty 17

## 2015-06-12 MED ORDER — SODIUM CHLORIDE 0.9 % IV SOLN: Freq: Once | INTRAVENOUS | Status: DC

## 2015-06-12 MED ORDER — SODIUM CHLORIDE 0.9 % IV SOLN
Freq: Once | INTRAVENOUS | Status: AC
  Administered 2015-06-12: 16:00:00 via INTRAVENOUS

## 2015-06-12 NOTE — Patient Instructions (Signed)

## 2015-06-12 NOTE — Telephone Encounter (Signed)
cld & spoke to pt and gave pt time & date of appt for 3/13 @2 :45-adv to getupdated copy of sch b4 leaving trmt room

## 2015-06-12 NOTE — Progress Notes (Signed)
Pt did not want to wait for observation - she stated she never waits and today she had another appt to get to. Feels fine. VS obtained and documented

## 2015-06-19 ENCOUNTER — Ambulatory Visit: Payer: Medicare Other

## 2015-06-19 ENCOUNTER — Telehealth: Payer: Self-pay | Admitting: Oncology

## 2015-06-19 DIAGNOSIS — R7989 Other specified abnormal findings of blood chemistry: Secondary | ICD-10-CM | POA: Diagnosis not present

## 2015-06-19 DIAGNOSIS — R1032 Left lower quadrant pain: Secondary | ICD-10-CM | POA: Diagnosis not present

## 2015-06-19 DIAGNOSIS — Z7682 Awaiting organ transplant status: Secondary | ICD-10-CM | POA: Diagnosis not present

## 2015-06-19 DIAGNOSIS — K429 Umbilical hernia without obstruction or gangrene: Secondary | ICD-10-CM | POA: Diagnosis not present

## 2015-06-19 DIAGNOSIS — R188 Other ascites: Secondary | ICD-10-CM | POA: Diagnosis not present

## 2015-06-19 DIAGNOSIS — K766 Portal hypertension: Secondary | ICD-10-CM | POA: Diagnosis not present

## 2015-06-19 DIAGNOSIS — N281 Cyst of kidney, acquired: Secondary | ICD-10-CM | POA: Diagnosis not present

## 2015-06-19 DIAGNOSIS — E1165 Type 2 diabetes mellitus with hyperglycemia: Secondary | ICD-10-CM | POA: Diagnosis not present

## 2015-06-19 DIAGNOSIS — R110 Nausea: Secondary | ICD-10-CM | POA: Diagnosis not present

## 2015-06-19 DIAGNOSIS — K7460 Unspecified cirrhosis of liver: Secondary | ICD-10-CM | POA: Diagnosis not present

## 2015-06-19 DIAGNOSIS — E089 Diabetes mellitus due to underlying condition without complications: Secondary | ICD-10-CM | POA: Diagnosis not present

## 2015-06-19 DIAGNOSIS — I781 Nevus, non-neoplastic: Secondary | ICD-10-CM | POA: Diagnosis not present

## 2015-06-19 DIAGNOSIS — R05 Cough: Secondary | ICD-10-CM | POA: Diagnosis not present

## 2015-06-19 DIAGNOSIS — R0981 Nasal congestion: Secondary | ICD-10-CM | POA: Diagnosis not present

## 2015-06-19 NOTE — Telephone Encounter (Signed)
s.w. pt and cx appt.Marland KitchenMarland KitchenMarland KitchenMarland Kitchenpt did not want to r/s///she is at [facility]

## 2015-06-20 DIAGNOSIS — Z7682 Awaiting organ transplant status: Secondary | ICD-10-CM | POA: Diagnosis not present

## 2015-06-20 DIAGNOSIS — Z90710 Acquired absence of both cervix and uterus: Secondary | ICD-10-CM | POA: Diagnosis not present

## 2015-06-20 DIAGNOSIS — N281 Cyst of kidney, acquired: Secondary | ICD-10-CM | POA: Diagnosis present

## 2015-06-20 DIAGNOSIS — K219 Gastro-esophageal reflux disease without esophagitis: Secondary | ICD-10-CM | POA: Diagnosis present

## 2015-06-20 DIAGNOSIS — F329 Major depressive disorder, single episode, unspecified: Secondary | ICD-10-CM | POA: Diagnosis present

## 2015-06-20 DIAGNOSIS — R188 Other ascites: Secondary | ICD-10-CM | POA: Diagnosis present

## 2015-06-20 DIAGNOSIS — I10 Essential (primary) hypertension: Secondary | ICD-10-CM | POA: Diagnosis present

## 2015-06-20 DIAGNOSIS — E089 Diabetes mellitus due to underlying condition without complications: Secondary | ICD-10-CM | POA: Diagnosis not present

## 2015-06-20 DIAGNOSIS — E1165 Type 2 diabetes mellitus with hyperglycemia: Secondary | ICD-10-CM | POA: Diagnosis present

## 2015-06-20 DIAGNOSIS — Z87891 Personal history of nicotine dependence: Secondary | ICD-10-CM | POA: Diagnosis not present

## 2015-06-20 DIAGNOSIS — K432 Incisional hernia without obstruction or gangrene: Secondary | ICD-10-CM | POA: Diagnosis present

## 2015-06-20 DIAGNOSIS — K766 Portal hypertension: Secondary | ICD-10-CM | POA: Diagnosis not present

## 2015-06-20 DIAGNOSIS — M810 Age-related osteoporosis without current pathological fracture: Secondary | ICD-10-CM | POA: Diagnosis present

## 2015-06-20 DIAGNOSIS — E785 Hyperlipidemia, unspecified: Secondary | ICD-10-CM | POA: Diagnosis present

## 2015-06-20 DIAGNOSIS — K7460 Unspecified cirrhosis of liver: Secondary | ICD-10-CM | POA: Diagnosis not present

## 2015-06-20 DIAGNOSIS — K7581 Nonalcoholic steatohepatitis (NASH): Secondary | ICD-10-CM | POA: Diagnosis present

## 2015-06-20 DIAGNOSIS — R110 Nausea: Secondary | ICD-10-CM | POA: Diagnosis not present

## 2015-06-20 DIAGNOSIS — Z794 Long term (current) use of insulin: Secondary | ICD-10-CM | POA: Diagnosis not present

## 2015-06-20 DIAGNOSIS — I350 Nonrheumatic aortic (valve) stenosis: Secondary | ICD-10-CM | POA: Diagnosis present

## 2015-06-20 DIAGNOSIS — K449 Diaphragmatic hernia without obstruction or gangrene: Secondary | ICD-10-CM | POA: Diagnosis present

## 2015-06-20 DIAGNOSIS — E7800 Pure hypercholesterolemia, unspecified: Secondary | ICD-10-CM | POA: Diagnosis present

## 2015-06-20 DIAGNOSIS — Z833 Family history of diabetes mellitus: Secondary | ICD-10-CM | POA: Diagnosis not present

## 2015-06-20 DIAGNOSIS — Z9049 Acquired absence of other specified parts of digestive tract: Secondary | ICD-10-CM | POA: Diagnosis not present

## 2015-06-20 DIAGNOSIS — R1032 Left lower quadrant pain: Secondary | ICD-10-CM | POA: Diagnosis not present

## 2015-06-23 ENCOUNTER — Telehealth: Payer: Self-pay | Admitting: Oncology

## 2015-06-23 ENCOUNTER — Telehealth: Payer: Self-pay | Admitting: *Deleted

## 2015-06-23 NOTE — Telephone Encounter (Signed)
Call from pt reporting she was inpatient at [facility] earlier this week, missed second dose of Feraheme. Requesting to reschedule. Asks if she will need to have 2 doses since there will be a over a week between doses.  Informed her she will not need an additional dose. Order sent to schedulers.

## 2015-06-23 NOTE — Telephone Encounter (Signed)
Spoke with patient to confirm appt on 3/29 at 3pm per 3/24 pof

## 2015-06-25 DIAGNOSIS — I361 Nonrheumatic tricuspid (valve) insufficiency: Secondary | ICD-10-CM | POA: Diagnosis present

## 2015-06-25 DIAGNOSIS — R739 Hyperglycemia, unspecified: Secondary | ICD-10-CM | POA: Diagnosis not present

## 2015-06-25 DIAGNOSIS — K7460 Unspecified cirrhosis of liver: Secondary | ICD-10-CM | POA: Diagnosis not present

## 2015-06-25 DIAGNOSIS — Z79899 Other long term (current) drug therapy: Secondary | ICD-10-CM | POA: Diagnosis not present

## 2015-06-25 DIAGNOSIS — R188 Other ascites: Secondary | ICD-10-CM | POA: Diagnosis not present

## 2015-06-25 DIAGNOSIS — Z90710 Acquired absence of both cervix and uterus: Secondary | ICD-10-CM | POA: Diagnosis not present

## 2015-06-25 DIAGNOSIS — Z792 Long term (current) use of antibiotics: Secondary | ICD-10-CM | POA: Diagnosis not present

## 2015-06-25 DIAGNOSIS — K5900 Constipation, unspecified: Secondary | ICD-10-CM | POA: Diagnosis not present

## 2015-06-25 DIAGNOSIS — N179 Acute kidney failure, unspecified: Secondary | ICD-10-CM | POA: Diagnosis not present

## 2015-06-25 DIAGNOSIS — R932 Abnormal findings on diagnostic imaging of liver and biliary tract: Secondary | ICD-10-CM | POA: Diagnosis not present

## 2015-06-25 DIAGNOSIS — J90 Pleural effusion, not elsewhere classified: Secondary | ICD-10-CM | POA: Diagnosis not present

## 2015-06-25 DIAGNOSIS — E875 Hyperkalemia: Secondary | ICD-10-CM | POA: Diagnosis not present

## 2015-06-25 DIAGNOSIS — Z4823 Encounter for aftercare following liver transplant: Secondary | ICD-10-CM | POA: Diagnosis not present

## 2015-06-25 DIAGNOSIS — N990 Postprocedural (acute) (chronic) kidney failure: Secondary | ICD-10-CM | POA: Diagnosis not present

## 2015-06-25 DIAGNOSIS — E871 Hypo-osmolality and hyponatremia: Secondary | ICD-10-CM | POA: Diagnosis not present

## 2015-06-25 DIAGNOSIS — I350 Nonrheumatic aortic (valve) stenosis: Secondary | ICD-10-CM | POA: Diagnosis not present

## 2015-06-25 DIAGNOSIS — Z9049 Acquired absence of other specified parts of digestive tract: Secondary | ICD-10-CM | POA: Diagnosis not present

## 2015-06-25 DIAGNOSIS — Z944 Liver transplant status: Secondary | ICD-10-CM | POA: Diagnosis not present

## 2015-06-25 DIAGNOSIS — E1165 Type 2 diabetes mellitus with hyperglycemia: Secondary | ICD-10-CM | POA: Diagnosis present

## 2015-06-25 DIAGNOSIS — K766 Portal hypertension: Secondary | ICD-10-CM | POA: Diagnosis not present

## 2015-06-25 DIAGNOSIS — Z794 Long term (current) use of insulin: Secondary | ICD-10-CM | POA: Diagnosis not present

## 2015-06-25 DIAGNOSIS — R600 Localized edema: Secondary | ICD-10-CM | POA: Diagnosis not present

## 2015-06-25 DIAGNOSIS — D649 Anemia, unspecified: Secondary | ICD-10-CM | POA: Diagnosis not present

## 2015-06-25 DIAGNOSIS — E7800 Pure hypercholesterolemia, unspecified: Secondary | ICD-10-CM | POA: Diagnosis present

## 2015-06-25 DIAGNOSIS — I8510 Secondary esophageal varices without bleeding: Secondary | ICD-10-CM | POA: Diagnosis present

## 2015-06-25 DIAGNOSIS — K7581 Nonalcoholic steatohepatitis (NASH): Secondary | ICD-10-CM | POA: Diagnosis not present

## 2015-06-25 DIAGNOSIS — Z9889 Other specified postprocedural states: Secondary | ICD-10-CM | POA: Diagnosis not present

## 2015-06-25 DIAGNOSIS — D899 Disorder involving the immune mechanism, unspecified: Secondary | ICD-10-CM | POA: Diagnosis not present

## 2015-06-25 DIAGNOSIS — E0800 Diabetes mellitus due to underlying condition with hyperosmolarity without nonketotic hyperglycemic-hyperosmolar coma (NKHHC): Secondary | ICD-10-CM | POA: Diagnosis not present

## 2015-06-25 DIAGNOSIS — E162 Hypoglycemia, unspecified: Secondary | ICD-10-CM | POA: Diagnosis not present

## 2015-06-25 DIAGNOSIS — E119 Type 2 diabetes mellitus without complications: Secondary | ICD-10-CM | POA: Diagnosis not present

## 2015-06-25 DIAGNOSIS — I10 Essential (primary) hypertension: Secondary | ICD-10-CM | POA: Diagnosis not present

## 2015-06-25 DIAGNOSIS — R262 Difficulty in walking, not elsewhere classified: Secondary | ICD-10-CM | POA: Diagnosis not present

## 2015-06-26 ENCOUNTER — Telehealth: Payer: Self-pay | Admitting: Oncology

## 2015-06-26 NOTE — Telephone Encounter (Signed)
returned call and lvm for pt confirming that 3029 appt cx due to pt in [facility] for liver transplant

## 2015-06-28 ENCOUNTER — Ambulatory Visit: Payer: Medicare Other

## 2015-07-05 DIAGNOSIS — Z944 Liver transplant status: Secondary | ICD-10-CM | POA: Diagnosis not present

## 2015-07-05 DIAGNOSIS — D899 Disorder involving the immune mechanism, unspecified: Secondary | ICD-10-CM | POA: Diagnosis not present

## 2015-07-07 DIAGNOSIS — Z944 Liver transplant status: Secondary | ICD-10-CM | POA: Diagnosis not present

## 2015-07-07 DIAGNOSIS — E089 Diabetes mellitus due to underlying condition without complications: Secondary | ICD-10-CM | POA: Diagnosis not present

## 2015-07-10 DIAGNOSIS — N179 Acute kidney failure, unspecified: Secondary | ICD-10-CM | POA: Diagnosis not present

## 2015-07-10 DIAGNOSIS — D899 Disorder involving the immune mechanism, unspecified: Secondary | ICD-10-CM | POA: Diagnosis not present

## 2015-07-10 DIAGNOSIS — Z792 Long term (current) use of antibiotics: Secondary | ICD-10-CM | POA: Diagnosis not present

## 2015-07-10 DIAGNOSIS — K432 Incisional hernia without obstruction or gangrene: Secondary | ICD-10-CM | POA: Diagnosis not present

## 2015-07-10 DIAGNOSIS — I10 Essential (primary) hypertension: Secondary | ICD-10-CM | POA: Diagnosis not present

## 2015-07-10 DIAGNOSIS — Z944 Liver transplant status: Secondary | ICD-10-CM | POA: Diagnosis not present

## 2015-07-10 DIAGNOSIS — R1032 Left lower quadrant pain: Secondary | ICD-10-CM | POA: Diagnosis not present

## 2015-07-10 DIAGNOSIS — E0800 Diabetes mellitus due to underlying condition with hyperosmolarity without nonketotic hyperglycemic-hyperosmolar coma (NKHHC): Secondary | ICD-10-CM | POA: Diagnosis not present

## 2015-07-10 DIAGNOSIS — K591 Functional diarrhea: Secondary | ICD-10-CM | POA: Diagnosis not present

## 2015-07-10 DIAGNOSIS — R109 Unspecified abdominal pain: Secondary | ICD-10-CM | POA: Diagnosis not present

## 2015-07-14 DIAGNOSIS — Z794 Long term (current) use of insulin: Secondary | ICD-10-CM | POA: Diagnosis not present

## 2015-07-14 DIAGNOSIS — M810 Age-related osteoporosis without current pathological fracture: Secondary | ICD-10-CM | POA: Diagnosis present

## 2015-07-14 DIAGNOSIS — D899 Disorder involving the immune mechanism, unspecified: Secondary | ICD-10-CM | POA: Diagnosis not present

## 2015-07-14 DIAGNOSIS — E7800 Pure hypercholesterolemia, unspecified: Secondary | ICD-10-CM | POA: Diagnosis present

## 2015-07-14 DIAGNOSIS — D649 Anemia, unspecified: Secondary | ICD-10-CM | POA: Diagnosis not present

## 2015-07-14 DIAGNOSIS — Z944 Liver transplant status: Secondary | ICD-10-CM | POA: Diagnosis not present

## 2015-07-14 DIAGNOSIS — E119 Type 2 diabetes mellitus without complications: Secondary | ICD-10-CM | POA: Diagnosis present

## 2015-07-14 DIAGNOSIS — I8500 Esophageal varices without bleeding: Secondary | ICD-10-CM | POA: Diagnosis present

## 2015-07-14 DIAGNOSIS — I10 Essential (primary) hypertension: Secondary | ICD-10-CM | POA: Diagnosis present

## 2015-07-14 DIAGNOSIS — Z87891 Personal history of nicotine dependence: Secondary | ICD-10-CM | POA: Diagnosis not present

## 2015-07-14 DIAGNOSIS — I350 Nonrheumatic aortic (valve) stenosis: Secondary | ICD-10-CM | POA: Diagnosis present

## 2015-07-15 DIAGNOSIS — D899 Disorder involving the immune mechanism, unspecified: Secondary | ICD-10-CM | POA: Diagnosis not present

## 2015-07-15 DIAGNOSIS — D649 Anemia, unspecified: Secondary | ICD-10-CM | POA: Diagnosis not present

## 2015-07-15 DIAGNOSIS — Z944 Liver transplant status: Secondary | ICD-10-CM | POA: Diagnosis not present

## 2015-07-17 DIAGNOSIS — Z7982 Long term (current) use of aspirin: Secondary | ICD-10-CM | POA: Diagnosis not present

## 2015-07-17 DIAGNOSIS — R0600 Dyspnea, unspecified: Secondary | ICD-10-CM | POA: Diagnosis not present

## 2015-07-17 DIAGNOSIS — Z792 Long term (current) use of antibiotics: Secondary | ICD-10-CM | POA: Diagnosis not present

## 2015-07-17 DIAGNOSIS — E119 Type 2 diabetes mellitus without complications: Secondary | ICD-10-CM | POA: Diagnosis not present

## 2015-07-17 DIAGNOSIS — D649 Anemia, unspecified: Secondary | ICD-10-CM | POA: Diagnosis not present

## 2015-07-17 DIAGNOSIS — B373 Candidiasis of vulva and vagina: Secondary | ICD-10-CM | POA: Diagnosis not present

## 2015-07-17 DIAGNOSIS — K432 Incisional hernia without obstruction or gangrene: Secondary | ICD-10-CM | POA: Diagnosis not present

## 2015-07-17 DIAGNOSIS — R109 Unspecified abdominal pain: Secondary | ICD-10-CM | POA: Diagnosis not present

## 2015-07-17 DIAGNOSIS — N179 Acute kidney failure, unspecified: Secondary | ICD-10-CM | POA: Diagnosis not present

## 2015-07-17 DIAGNOSIS — R61 Generalized hyperhidrosis: Secondary | ICD-10-CM | POA: Diagnosis not present

## 2015-07-17 DIAGNOSIS — D508 Other iron deficiency anemias: Secondary | ICD-10-CM | POA: Diagnosis not present

## 2015-07-17 DIAGNOSIS — D62 Acute posthemorrhagic anemia: Secondary | ICD-10-CM | POA: Diagnosis not present

## 2015-07-17 DIAGNOSIS — Z794 Long term (current) use of insulin: Secondary | ICD-10-CM | POA: Diagnosis not present

## 2015-07-17 DIAGNOSIS — R0602 Shortness of breath: Secondary | ICD-10-CM | POA: Diagnosis not present

## 2015-07-17 DIAGNOSIS — R188 Other ascites: Secondary | ICD-10-CM | POA: Diagnosis not present

## 2015-07-17 DIAGNOSIS — R5383 Other fatigue: Secondary | ICD-10-CM | POA: Diagnosis not present

## 2015-07-17 DIAGNOSIS — I10 Essential (primary) hypertension: Secondary | ICD-10-CM | POA: Diagnosis not present

## 2015-07-17 DIAGNOSIS — Z4823 Encounter for aftercare following liver transplant: Secondary | ICD-10-CM | POA: Diagnosis not present

## 2015-07-20 ENCOUNTER — Telehealth: Payer: Self-pay | Admitting: Oncology

## 2015-07-20 DIAGNOSIS — D899 Disorder involving the immune mechanism, unspecified: Secondary | ICD-10-CM | POA: Diagnosis not present

## 2015-07-20 DIAGNOSIS — Z792 Long term (current) use of antibiotics: Secondary | ICD-10-CM | POA: Diagnosis not present

## 2015-07-20 DIAGNOSIS — Z944 Liver transplant status: Secondary | ICD-10-CM | POA: Diagnosis not present

## 2015-07-20 NOTE — Telephone Encounter (Signed)
pt called to cx lab due to just getting out of the hospital

## 2015-07-24 ENCOUNTER — Other Ambulatory Visit: Payer: Medicare Other

## 2015-07-24 DIAGNOSIS — Z944 Liver transplant status: Secondary | ICD-10-CM | POA: Diagnosis not present

## 2015-07-24 DIAGNOSIS — I10 Essential (primary) hypertension: Secondary | ICD-10-CM | POA: Diagnosis not present

## 2015-07-24 DIAGNOSIS — Z9049 Acquired absence of other specified parts of digestive tract: Secondary | ICD-10-CM | POA: Diagnosis not present

## 2015-07-24 DIAGNOSIS — Z4823 Encounter for aftercare following liver transplant: Secondary | ICD-10-CM | POA: Diagnosis not present

## 2015-07-24 DIAGNOSIS — Z87891 Personal history of nicotine dependence: Secondary | ICD-10-CM | POA: Diagnosis not present

## 2015-07-24 DIAGNOSIS — D649 Anemia, unspecified: Secondary | ICD-10-CM | POA: Diagnosis not present

## 2015-07-24 DIAGNOSIS — Z792 Long term (current) use of antibiotics: Secondary | ICD-10-CM | POA: Diagnosis not present

## 2015-07-24 DIAGNOSIS — B373 Candidiasis of vulva and vagina: Secondary | ICD-10-CM | POA: Diagnosis not present

## 2015-07-24 DIAGNOSIS — Z8719 Personal history of other diseases of the digestive system: Secondary | ICD-10-CM | POA: Diagnosis not present

## 2015-07-24 DIAGNOSIS — R161 Splenomegaly, not elsewhere classified: Secondary | ICD-10-CM | POA: Diagnosis not present

## 2015-07-24 DIAGNOSIS — R0602 Shortness of breath: Secondary | ICD-10-CM | POA: Diagnosis not present

## 2015-07-24 DIAGNOSIS — R0989 Other specified symptoms and signs involving the circulatory and respiratory systems: Secondary | ICD-10-CM | POA: Diagnosis not present

## 2015-07-24 DIAGNOSIS — Z7982 Long term (current) use of aspirin: Secondary | ICD-10-CM | POA: Diagnosis not present

## 2015-07-24 DIAGNOSIS — R05 Cough: Secondary | ICD-10-CM | POA: Diagnosis not present

## 2015-07-24 DIAGNOSIS — R5383 Other fatigue: Secondary | ICD-10-CM | POA: Diagnosis not present

## 2015-07-24 DIAGNOSIS — Z90710 Acquired absence of both cervix and uterus: Secondary | ICD-10-CM | POA: Diagnosis not present

## 2015-07-24 DIAGNOSIS — R61 Generalized hyperhidrosis: Secondary | ICD-10-CM | POA: Diagnosis not present

## 2015-07-24 DIAGNOSIS — R0981 Nasal congestion: Secondary | ICD-10-CM | POA: Diagnosis not present

## 2015-07-24 DIAGNOSIS — D638 Anemia in other chronic diseases classified elsewhere: Secondary | ICD-10-CM | POA: Diagnosis not present

## 2015-07-24 DIAGNOSIS — K432 Incisional hernia without obstruction or gangrene: Secondary | ICD-10-CM | POA: Diagnosis not present

## 2015-07-27 DIAGNOSIS — D696 Thrombocytopenia, unspecified: Secondary | ICD-10-CM | POA: Diagnosis not present

## 2015-07-27 DIAGNOSIS — R0602 Shortness of breath: Secondary | ICD-10-CM | POA: Diagnosis not present

## 2015-07-27 DIAGNOSIS — Z5181 Encounter for therapeutic drug level monitoring: Secondary | ICD-10-CM | POA: Diagnosis not present

## 2015-07-27 DIAGNOSIS — R918 Other nonspecific abnormal finding of lung field: Secondary | ICD-10-CM | POA: Diagnosis not present

## 2015-07-27 DIAGNOSIS — B348 Other viral infections of unspecified site: Secondary | ICD-10-CM | POA: Diagnosis not present

## 2015-07-27 DIAGNOSIS — Z79899 Other long term (current) drug therapy: Secondary | ICD-10-CM | POA: Diagnosis not present

## 2015-07-27 DIAGNOSIS — Z7982 Long term (current) use of aspirin: Secondary | ICD-10-CM | POA: Diagnosis not present

## 2015-07-27 DIAGNOSIS — J029 Acute pharyngitis, unspecified: Secondary | ICD-10-CM | POA: Diagnosis not present

## 2015-07-27 DIAGNOSIS — J9811 Atelectasis: Secondary | ICD-10-CM | POA: Diagnosis not present

## 2015-07-27 DIAGNOSIS — Z87891 Personal history of nicotine dependence: Secondary | ICD-10-CM | POA: Diagnosis not present

## 2015-07-27 DIAGNOSIS — Z298 Encounter for other specified prophylactic measures: Secondary | ICD-10-CM | POA: Diagnosis not present

## 2015-07-27 DIAGNOSIS — R05 Cough: Secondary | ICD-10-CM | POA: Diagnosis not present

## 2015-07-27 DIAGNOSIS — J3489 Other specified disorders of nose and nasal sinuses: Secondary | ICD-10-CM | POA: Diagnosis not present

## 2015-07-27 DIAGNOSIS — Z944 Liver transplant status: Secondary | ICD-10-CM | POA: Diagnosis not present

## 2015-07-31 DIAGNOSIS — R61 Generalized hyperhidrosis: Secondary | ICD-10-CM | POA: Diagnosis not present

## 2015-07-31 DIAGNOSIS — R0602 Shortness of breath: Secondary | ICD-10-CM | POA: Diagnosis not present

## 2015-07-31 DIAGNOSIS — K432 Incisional hernia without obstruction or gangrene: Secondary | ICD-10-CM | POA: Diagnosis not present

## 2015-07-31 DIAGNOSIS — R0981 Nasal congestion: Secondary | ICD-10-CM | POA: Diagnosis not present

## 2015-07-31 DIAGNOSIS — B348 Other viral infections of unspecified site: Secondary | ICD-10-CM | POA: Diagnosis not present

## 2015-07-31 DIAGNOSIS — Z944 Liver transplant status: Secondary | ICD-10-CM | POA: Diagnosis not present

## 2015-07-31 DIAGNOSIS — B370 Candidal stomatitis: Secondary | ICD-10-CM | POA: Diagnosis not present

## 2015-07-31 DIAGNOSIS — Z792 Long term (current) use of antibiotics: Secondary | ICD-10-CM | POA: Diagnosis not present

## 2015-07-31 DIAGNOSIS — D899 Disorder involving the immune mechanism, unspecified: Secondary | ICD-10-CM | POA: Diagnosis not present

## 2015-08-04 DIAGNOSIS — E119 Type 2 diabetes mellitus without complications: Secondary | ICD-10-CM | POA: Diagnosis not present

## 2015-08-04 DIAGNOSIS — Z944 Liver transplant status: Secondary | ICD-10-CM | POA: Diagnosis not present

## 2015-08-04 DIAGNOSIS — Z298 Encounter for other specified prophylactic measures: Secondary | ICD-10-CM | POA: Diagnosis not present

## 2015-08-04 DIAGNOSIS — Z794 Long term (current) use of insulin: Secondary | ICD-10-CM | POA: Diagnosis not present

## 2015-08-07 DIAGNOSIS — K432 Incisional hernia without obstruction or gangrene: Secondary | ICD-10-CM | POA: Diagnosis not present

## 2015-08-07 DIAGNOSIS — N179 Acute kidney failure, unspecified: Secondary | ICD-10-CM | POA: Diagnosis not present

## 2015-08-07 DIAGNOSIS — I10 Essential (primary) hypertension: Secondary | ICD-10-CM | POA: Diagnosis not present

## 2015-08-07 DIAGNOSIS — D899 Disorder involving the immune mechanism, unspecified: Secondary | ICD-10-CM | POA: Diagnosis not present

## 2015-08-07 DIAGNOSIS — Z944 Liver transplant status: Secondary | ICD-10-CM | POA: Diagnosis not present

## 2015-08-07 DIAGNOSIS — Z792 Long term (current) use of antibiotics: Secondary | ICD-10-CM | POA: Diagnosis not present

## 2015-08-07 DIAGNOSIS — R05 Cough: Secondary | ICD-10-CM | POA: Diagnosis not present

## 2015-08-14 DIAGNOSIS — Z944 Liver transplant status: Secondary | ICD-10-CM | POA: Diagnosis not present

## 2015-08-14 DIAGNOSIS — Z298 Encounter for other specified prophylactic measures: Secondary | ICD-10-CM | POA: Diagnosis not present

## 2015-08-21 DIAGNOSIS — Z298 Encounter for other specified prophylactic measures: Secondary | ICD-10-CM | POA: Diagnosis not present

## 2015-08-21 DIAGNOSIS — Z944 Liver transplant status: Secondary | ICD-10-CM | POA: Diagnosis not present

## 2015-08-21 DIAGNOSIS — D899 Disorder involving the immune mechanism, unspecified: Secondary | ICD-10-CM | POA: Diagnosis not present

## 2015-08-24 DIAGNOSIS — Z944 Liver transplant status: Secondary | ICD-10-CM | POA: Diagnosis not present

## 2015-08-24 DIAGNOSIS — Z298 Encounter for other specified prophylactic measures: Secondary | ICD-10-CM | POA: Diagnosis not present

## 2015-08-30 DIAGNOSIS — I517 Cardiomegaly: Secondary | ICD-10-CM | POA: Diagnosis not present

## 2015-08-30 DIAGNOSIS — Z298 Encounter for other specified prophylactic measures: Secondary | ICD-10-CM | POA: Diagnosis not present

## 2015-08-30 DIAGNOSIS — I25118 Atherosclerotic heart disease of native coronary artery with other forms of angina pectoris: Secondary | ICD-10-CM | POA: Diagnosis not present

## 2015-08-30 DIAGNOSIS — R931 Abnormal findings on diagnostic imaging of heart and coronary circulation: Secondary | ICD-10-CM | POA: Diagnosis not present

## 2015-08-30 DIAGNOSIS — Z794 Long term (current) use of insulin: Secondary | ICD-10-CM | POA: Diagnosis not present

## 2015-08-30 DIAGNOSIS — E785 Hyperlipidemia, unspecified: Secondary | ICD-10-CM | POA: Diagnosis not present

## 2015-08-30 DIAGNOSIS — Z79899 Other long term (current) drug therapy: Secondary | ICD-10-CM | POA: Diagnosis not present

## 2015-08-30 DIAGNOSIS — I350 Nonrheumatic aortic (valve) stenosis: Secondary | ICD-10-CM | POA: Diagnosis not present

## 2015-08-30 DIAGNOSIS — I110 Hypertensive heart disease with heart failure: Secondary | ICD-10-CM | POA: Diagnosis not present

## 2015-08-30 DIAGNOSIS — D899 Disorder involving the immune mechanism, unspecified: Secondary | ICD-10-CM | POA: Diagnosis not present

## 2015-08-30 DIAGNOSIS — Z7982 Long term (current) use of aspirin: Secondary | ICD-10-CM | POA: Diagnosis not present

## 2015-08-30 DIAGNOSIS — Z87891 Personal history of nicotine dependence: Secondary | ICD-10-CM | POA: Diagnosis not present

## 2015-08-30 DIAGNOSIS — Z944 Liver transplant status: Secondary | ICD-10-CM | POA: Diagnosis not present

## 2015-08-30 DIAGNOSIS — I509 Heart failure, unspecified: Secondary | ICD-10-CM | POA: Diagnosis not present

## 2015-09-13 DIAGNOSIS — Z298 Encounter for other specified prophylactic measures: Secondary | ICD-10-CM | POA: Diagnosis not present

## 2015-09-13 DIAGNOSIS — Z944 Liver transplant status: Secondary | ICD-10-CM | POA: Diagnosis not present

## 2015-10-23 ENCOUNTER — Other Ambulatory Visit: Payer: Medicare Other

## 2015-10-23 ENCOUNTER — Ambulatory Visit: Payer: Medicare Other | Admitting: Oncology

## 2015-12-13 ENCOUNTER — Ambulatory Visit (INDEPENDENT_AMBULATORY_CARE_PROVIDER_SITE_OTHER): Payer: Medicare Other | Admitting: Ophthalmology

## 2015-12-13 DIAGNOSIS — H35033 Hypertensive retinopathy, bilateral: Secondary | ICD-10-CM

## 2015-12-13 DIAGNOSIS — I10 Essential (primary) hypertension: Secondary | ICD-10-CM

## 2015-12-13 DIAGNOSIS — E11319 Type 2 diabetes mellitus with unspecified diabetic retinopathy without macular edema: Secondary | ICD-10-CM

## 2015-12-13 DIAGNOSIS — E113293 Type 2 diabetes mellitus with mild nonproliferative diabetic retinopathy without macular edema, bilateral: Secondary | ICD-10-CM

## 2015-12-13 DIAGNOSIS — H43813 Vitreous degeneration, bilateral: Secondary | ICD-10-CM | POA: Diagnosis not present

## 2015-12-22 IMAGING — US US PARACENTESIS
1 series · 9 of 9 positions shown · non-contrast
Comparison: Paracentesis 09/12/14.

MEDICATIONS:
None.

COMPLICATIONS:
None immediate

INDICATION: Cirrhosis, recurrent ascites and request for paracentesis.

EXAM:
ULTRASOUND-GUIDED PARACENTESIS
TECHNIQUE: Informed written consent was obtained from the patient after a
discussion of the risks, benefits and alternatives to treatment. A
timeout was performed prior to the initiation of the procedure.

[Series 1: us paracentesis · 0.27mm/px · 9 of 9 slices shown]
[im 1/9]
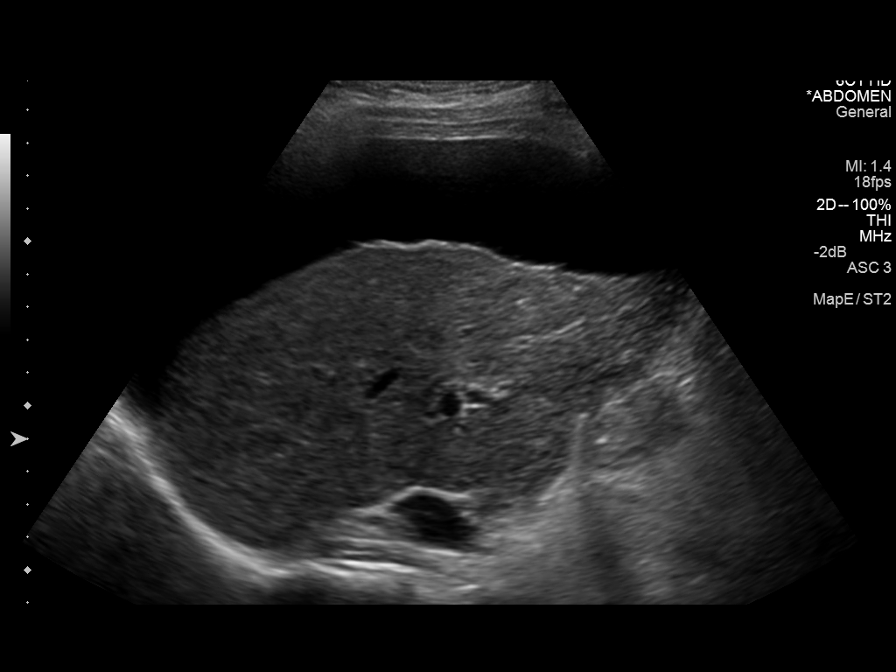
[im 2/9]
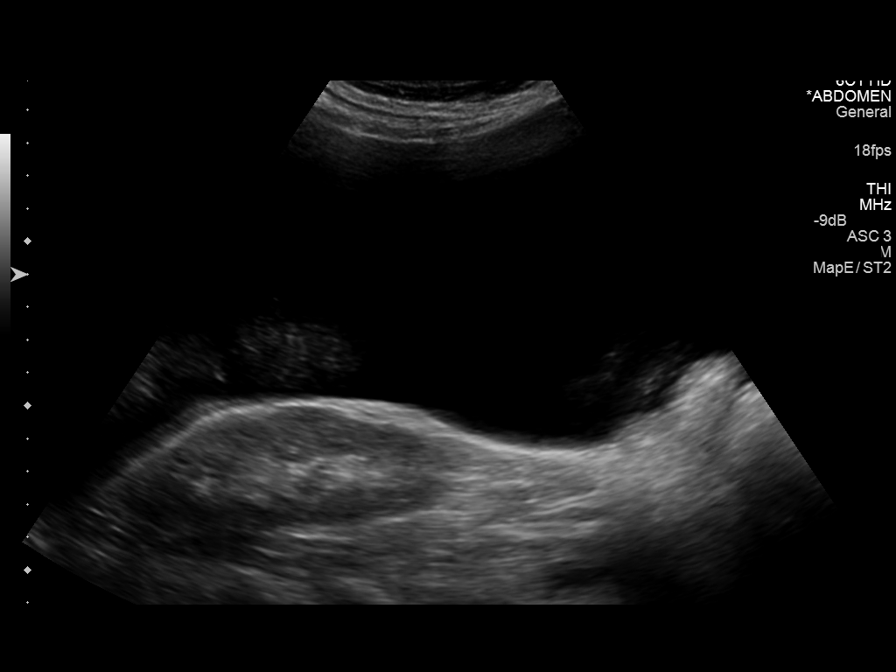
[im 3/9]
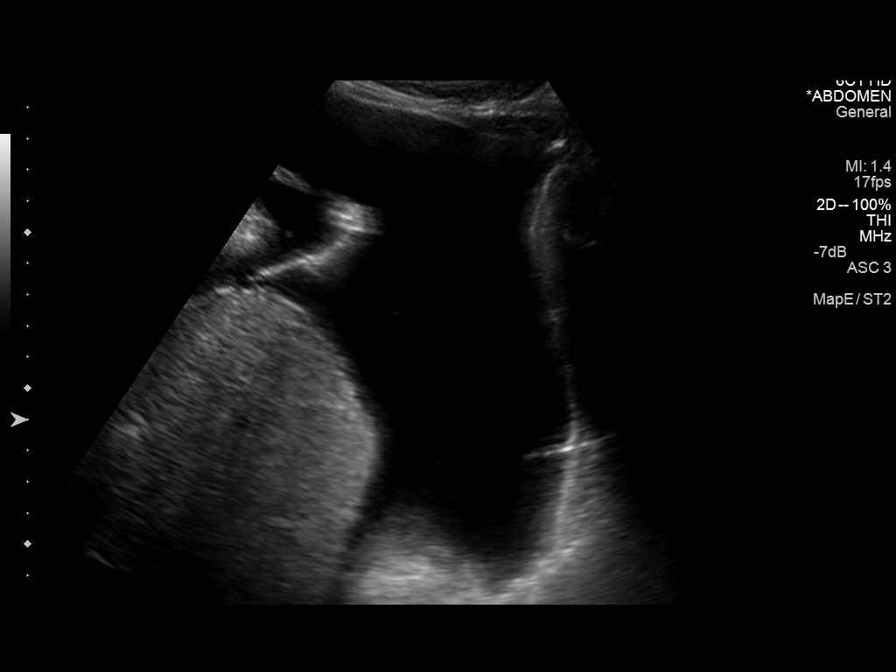
[im 4/9]
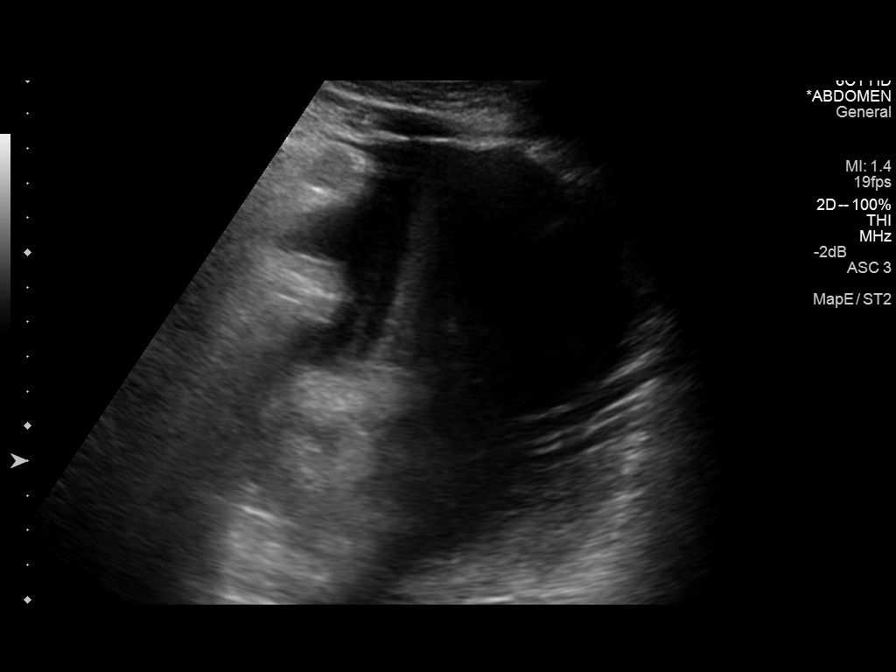
[im 5/9]
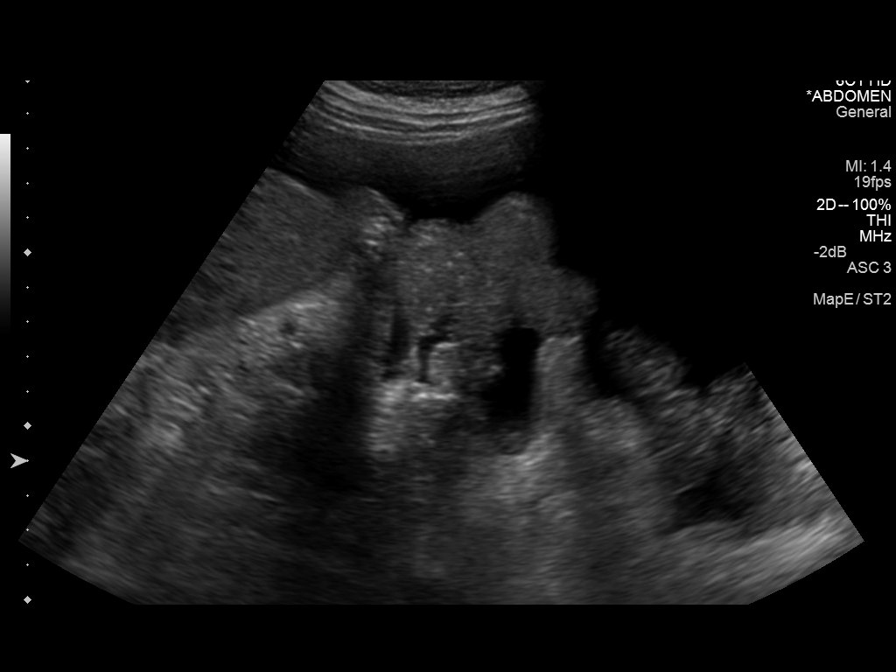
[im 6/9]
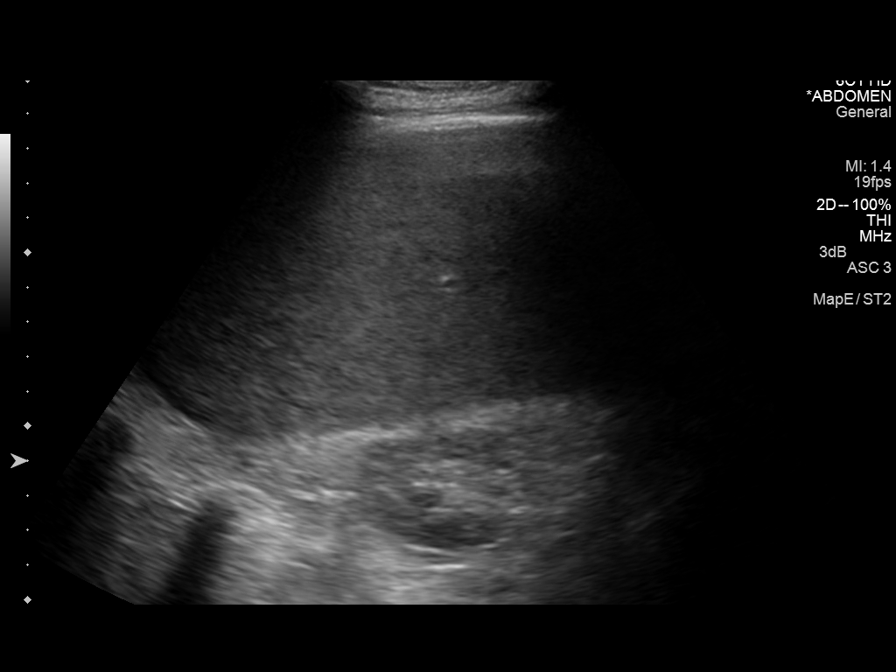
[im 7/9]
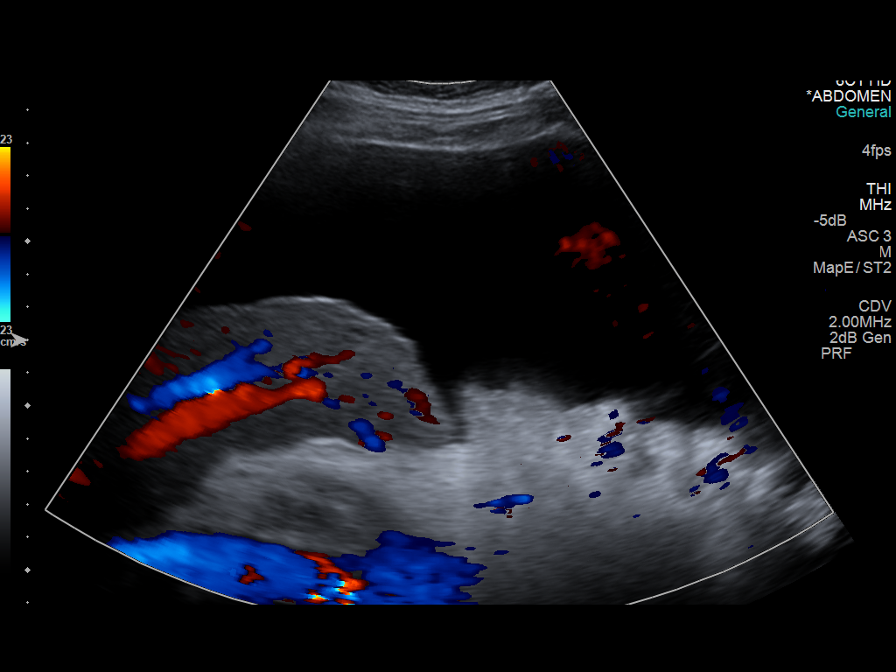
[im 8/9]
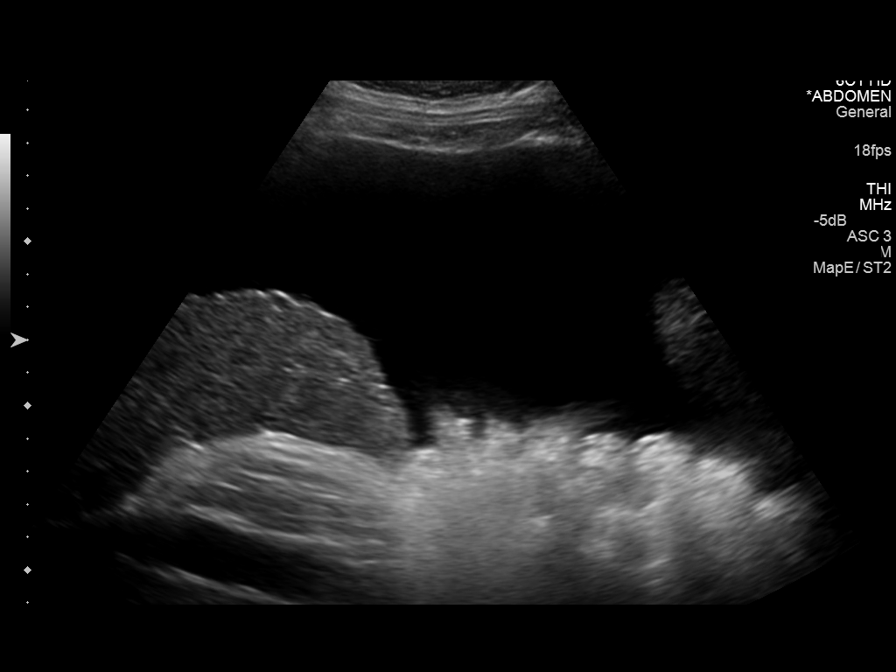
[im 9/9]
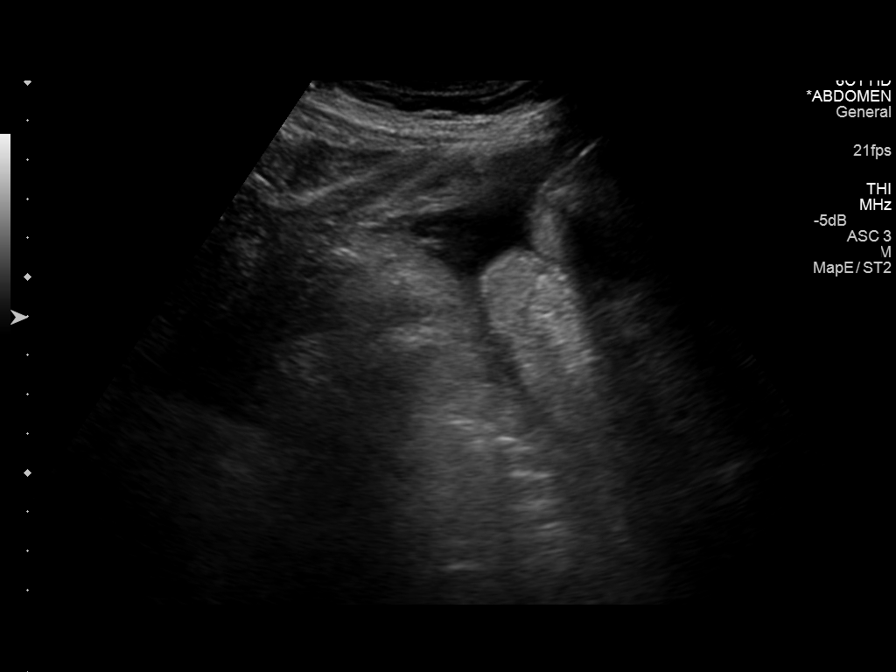

[9 of 9 positions shown; findings below may reference images not displayed]

Initial ultrasound scanning demonstrates a large amount of ascites
within the right lower abdominal quadrant. The right lower abdomen
was prepped and draped in the usual sterile fashion. 1% lidocaine
was used for local anesthesia.

Under direct ultrasound guidance, a 19 gauge, 7-cm, Yueh catheter
was introduced. An ultrasound image was saved for documentation
purposed. The paracentesis was performed. The catheter was removed
and a dressing was applied. The patient tolerated the procedure well
without immediate post procedural complication. Post procedure IV
Albumin was ordered.
FINDINGS: A total of approximately 6 liters of serous fluid was removed.
IMPRESSION: Successful ultrasound-guided paracentesis yielding 6 liters of
peritoneal fluid.

## 2016-03-11 IMAGING — US US PARACENTESIS
1 series · 13 of 13 positions shown · non-contrast
Comparison: Paracentesis 11/25/2014.

MEDICATIONS:
None.

COMPLICATIONS:
None immediate

INDICATION: Cirrhosis, ascites and request for therapeutic paracentesis.

EXAM:
ULTRASOUND-GUIDED PARACENTESIS
TECHNIQUE: Informed written consent was obtained from the patient after a
discussion of the risks, benefits and alternatives to treatment. A
timeout was performed prior to the initiation of the procedure.

[Series 1: us paracentesis · 0.24mm/px · 13 of 13 slices shown]
[im 1/13]
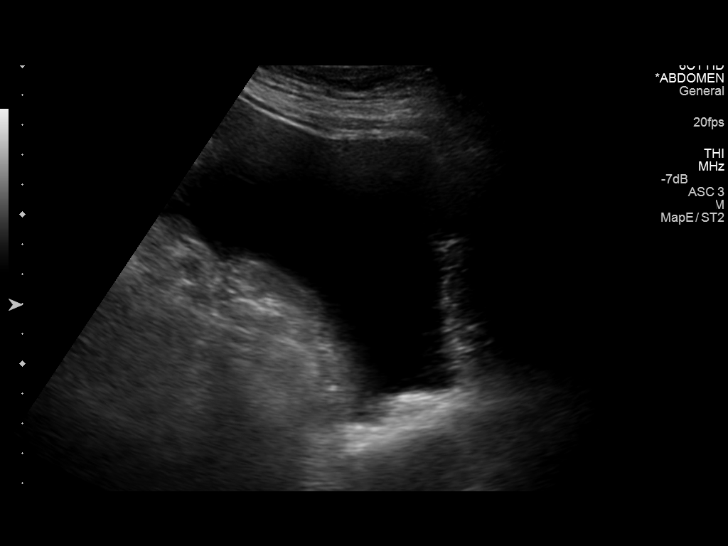
[im 2/13]
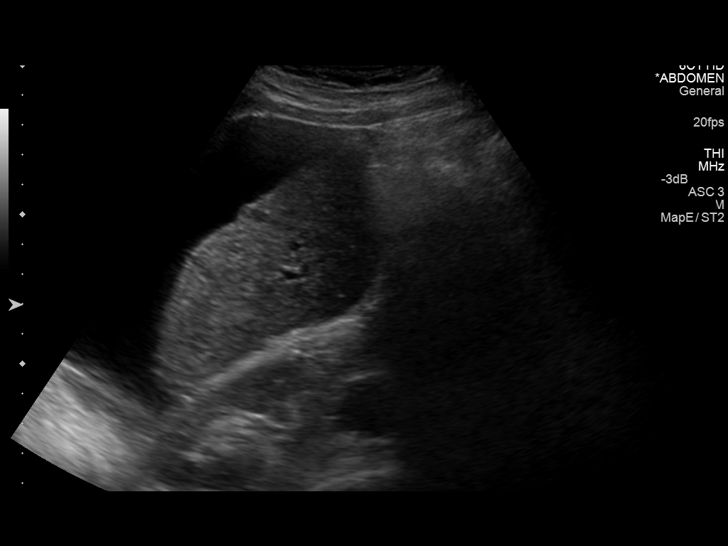
[im 3/13]
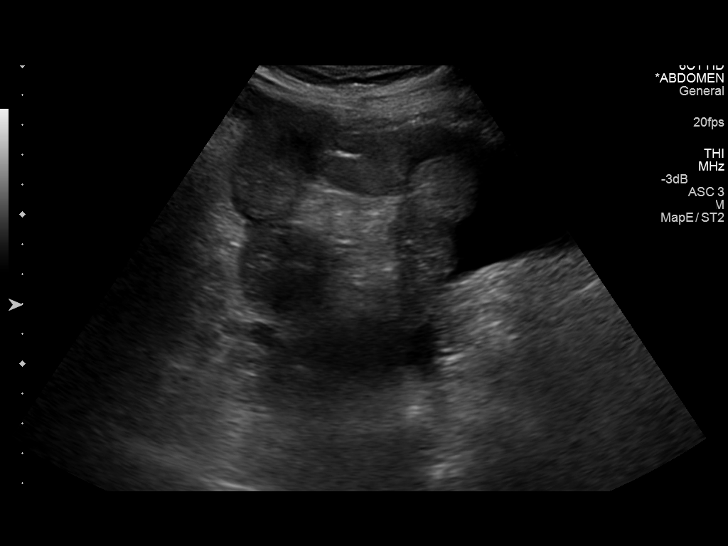
[im 4/13]
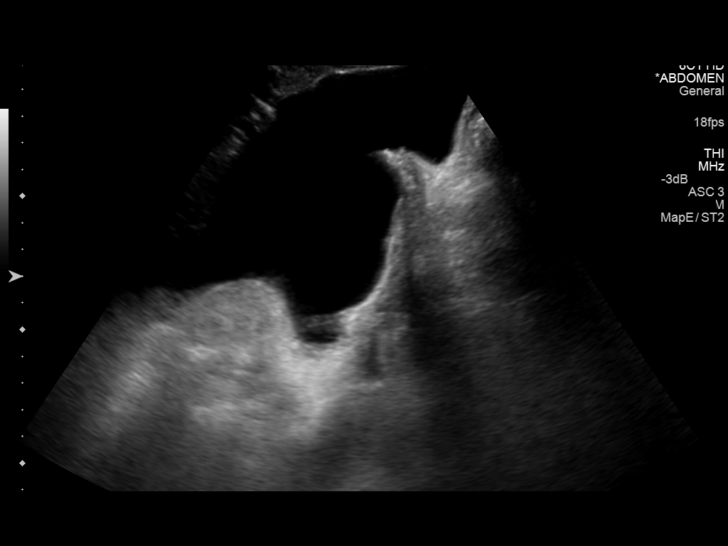
[im 5/13]
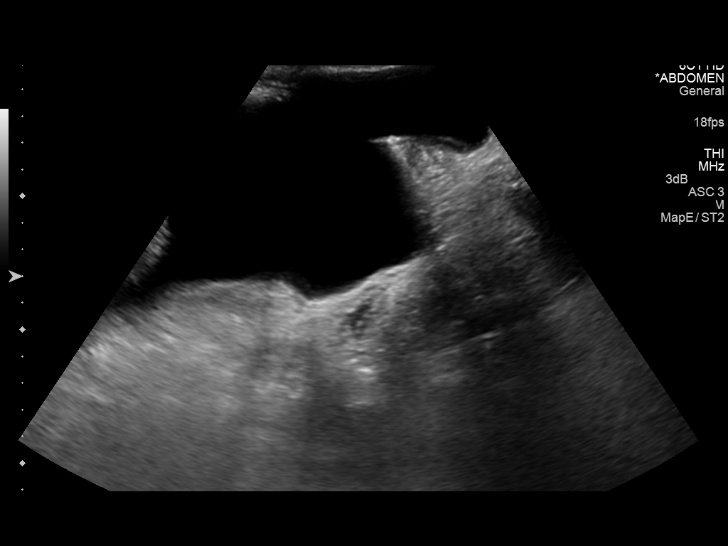
[im 6/13]
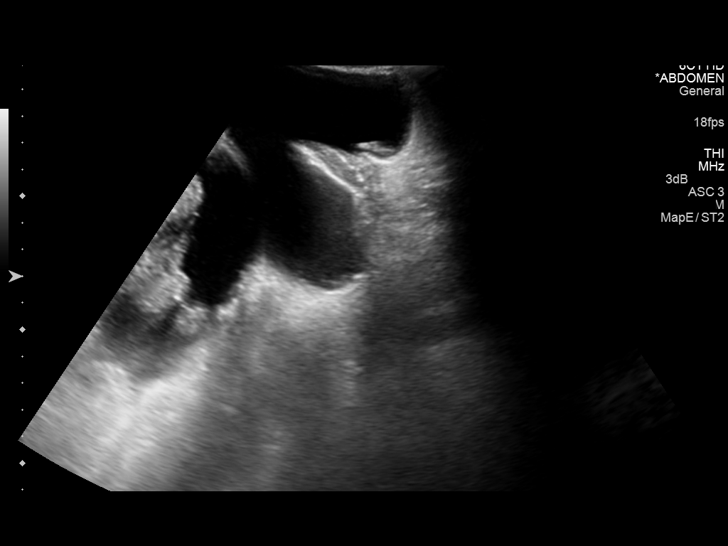
[im 7/13]
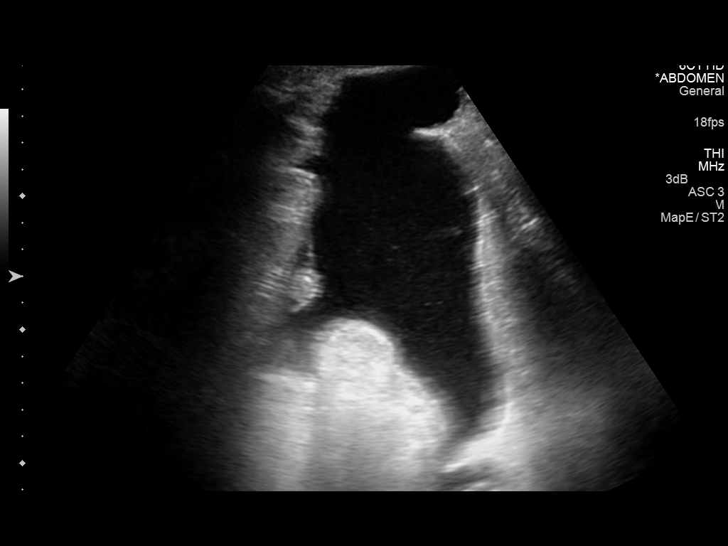
[im 8/13]
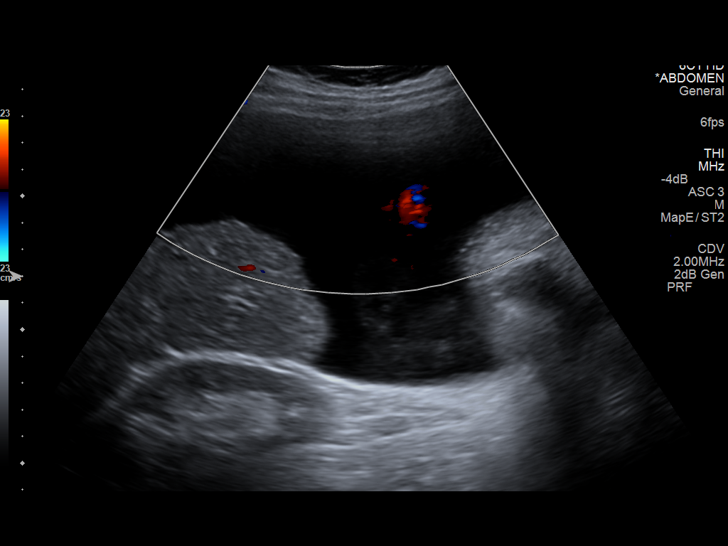
[im 9/13]
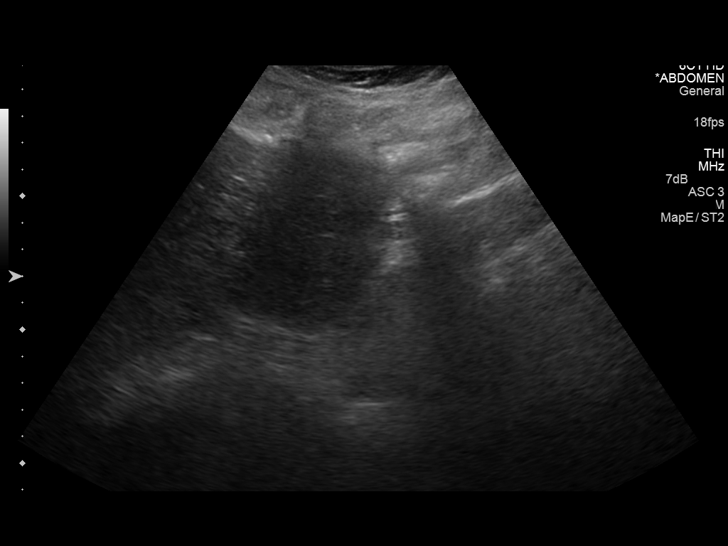
[im 10/13]
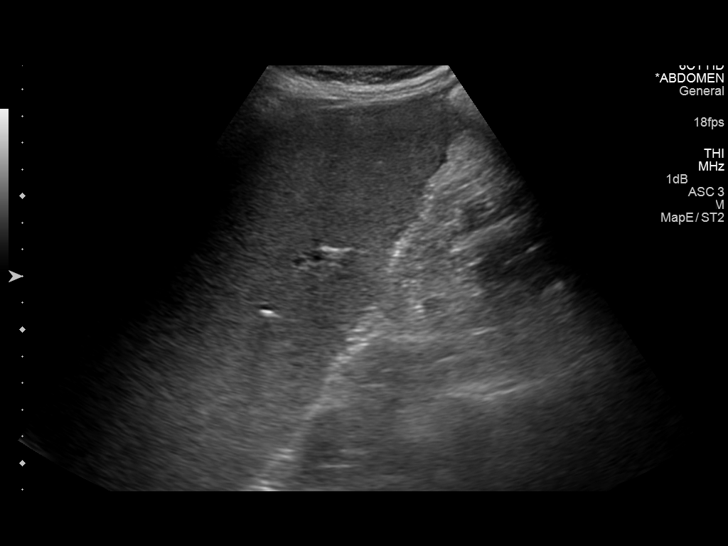
[im 11/13]
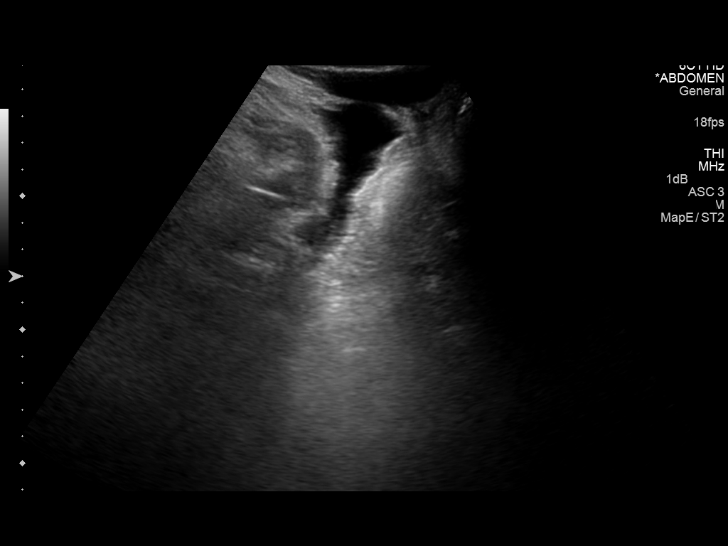
[im 12/13]
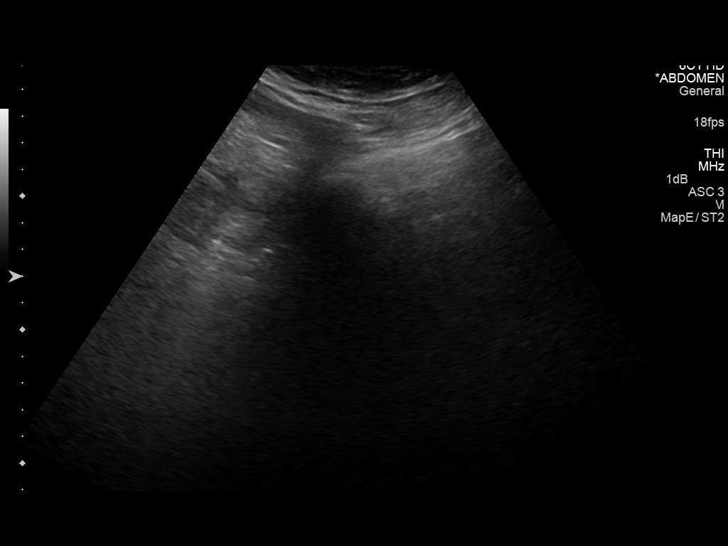
[im 13/13]
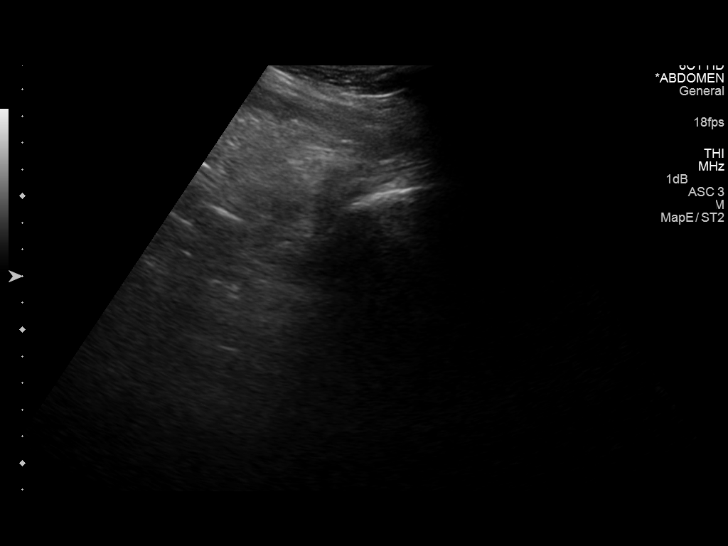

[13 of 13 positions shown; findings below may reference images not displayed]

Initial ultrasound scanning demonstrates a moderate amount of
ascites within the left lower abdominal quadrant. The left lower
abdomen was prepped and draped in the usual sterile fashion. 1%
lidocaine was used for local anesthesia.

Under direct ultrasound guidance, a 19 gauge, 7-cm, Yueh catheter
was introduced. An ultrasound image was saved for documentation
purposed. The paracentesis was performed. The catheter was removed
and a dressing was applied. The patient tolerated the procedure well
without immediate post procedural complication.
FINDINGS: A total of approximately 3 liters of serous fluid was removed.
IMPRESSION: Successful ultrasound-guided paracentesis yielding 3 liters of
peritoneal fluid.

## 2016-11-20 ENCOUNTER — Inpatient Hospital Stay
Admit: 2016-11-20 | Discharge: 2016-11-20 | Disposition: A | Discharge status: Home / Self Care | Payer: PRIVATE HEALTH INSURANCE | Attending: Emergency Medicine

## 2016-11-20 ENCOUNTER — Emergency Department: Admit: 2016-11-20 | Payer: PRIVATE HEALTH INSURANCE

## 2016-11-20 DIAGNOSIS — S0181XA Laceration without foreign body of other part of head, initial encounter: Secondary | ICD-10-CM

## 2016-11-20 MED ORDER — LIDOCAINE-EPINEPHRINE 1 %-1:100,000 IJ SOLN
1 %-:00,000 | Freq: Once | INTRAMUSCULAR | Status: AC
Start: 2016-11-20 — End: 2016-11-20
  Administered 2016-11-20: 22:00:00 via INTRADERMAL

## 2016-11-20 MED ORDER — NEOMYCIN-BACITRACN ZN-POLYMYXIN 3.5 MG-400 UNIT-5,000 UNIT TOP OINT PK
3.5-400-5000 mg-unit-unit | CUTANEOUS | Status: AC
Start: 2016-11-20 — End: 2016-11-20
  Administered 2016-11-20: 22:00:00 via TOPICAL

## 2016-11-20 MED ORDER — OXYCODONE-ACETAMINOPHEN 5 MG-325 MG TAB
5-325 mg | ORAL_TABLET | ORAL | 0 refills | Status: AC | PRN
Start: 2016-11-20 — End: ?

## 2016-11-20 MED ORDER — CEPHALEXIN 250 MG CAP
250 mg | ORAL_CAPSULE | Freq: Two times a day (BID) | ORAL | 0 refills | Status: AC
Start: 2016-11-20 — End: ?

## 2016-11-20 MED FILL — LIDOCAINE-EPINEPHRINE 1 %-1:100,000 IJ SOLN: 1 %-:00,000 | INTRAMUSCULAR | Qty: 20

## 2016-11-20 MED FILL — TRIPLE ANTIBIOTIC 3.5 MG-400 UNIT-5,000 UNIT TOPICAL OINTMENT PACKET: 3.5-400-5000 mg-unit-unit | CUTANEOUS | Qty: 1

## 2016-11-20 NOTE — ED Notes (Signed)
Discharge note: The patient was discharged home in stable condition, accompanied by family member. The patient is alert and oriented, is in no respiratory distress and has vital signs within normal limits. The patient's diagnosis, condition and treatment were explained to patient by Dr Delane Ginger. The patient expressed understanding of discharge instructions, prescriptions, and plan of care.  A discharge plan has been developed. A case manager was not involved in the process. Patient assisted out to car via wheelchair to go home with family member.

## 2016-11-20 NOTE — ED Notes (Signed)
Pt c/o "left knee felt funny when I walked on it like its swollen." Dr Delane Ginger made aware. Knee xray ordered.

## 2016-11-20 NOTE — ED Notes (Signed)
Pt to CT via stretcher accompanied by CT Tech.

## 2016-11-20 NOTE — ED Notes (Signed)
Pt returned from CT. Pt ambulated to the bathroom with a steady gait.

## 2016-11-20 NOTE — ED Triage Notes (Signed)
Pt arrives via [employer]. Pt states "I was in my daughters basement I tripped over a board I fell into some boxes then hit my right face on the floor. I had glasses on they cut my face but they did not break." Pt denies passing out. Denies nausea. Pt complains of a headache. Right eye is swollen with bruising pt has 3 small lacerations to her face and 1 laceration to right forearm. Bleeding is controlled at this time pt is applying ice to face.

## 2016-11-20 NOTE — ED Notes (Signed)
Pt to xray via stretcher accompanied by xray tech.

## 2016-11-20 NOTE — ED Provider Notes (Addendum)
HPI Comments: S/P liver transplant; CAD S/P CABG and stents; presents via EMS after a GLF; states she tripped over a box in her basement, fell, and ultimately struck her face against the concrete; has right periorbital bruising and swelling, nasal pain and lacerations; + 8/10 HA; initial N; + dizziness; no mental status changes; no LOC/amnesia.  Takes ASA/Plavix.  Also c/o left knee pain.         No past medical history on file.    No past surgical history on file.      No family history on file.    Social History     Social History   ??? Marital status: N/A     Spouse name: N/A   ??? Number of children: N/A   ??? Years of education: N/A     Occupational History   ??? Not on file.     Social History Main Topics   ??? Smoking status: Not on file   ??? Smokeless tobacco: Not on file   ??? Alcohol use Not on file   ??? Drug use: Not on file   ??? Sexual activity: Not on file     Other Topics Concern   ??? Not on file     Social History Narrative         ALLERGIES: Fentanyl and Valium [diazepam]    Review of Systems   All other systems reviewed and are negative.      There were no vitals filed for this visit.         Physical Exam   Constitutional: She appears well-developed and well-nourished.   HENT:   Head: Normocephalic.   Right periorbital edema and ecchymosis (eye almost swollen shut).  2 cm lac right forehead above nose; 1 cm lac bridge of nose; right infraorbital and nasal tenderness   Eyes: Conjunctivae are normal. No scleral icterus.   Neck: No JVD present. No tracheal deviation present.   Cardiovascular: Normal rate.    Pulmonary/Chest: Effort normal.   Abdominal: She exhibits no distension.   Musculoskeletal: She exhibits no edema.   No left knee tenderness; painful ROM.  No swelling.     Neurological: She is alert.   Oriented; good historian.     Skin: No rash noted. No pallor.   Psychiatric: She has a normal mood and affect.        MDM      ED Course       Wound Repair  Date/Time: 11/20/2016 7:14 PM   Performed by: attendingPreparation: skin prepped with Betadine  Pre-procedure re-eval: Immediately prior to the procedure, the patient was reevaluated and found suitable for the planned procedure and any planned medications.  Location details: face  Wound length:2.5 cm or less  Anesthesia: local infiltration    Anesthesia:  Local Anesthetic: lidocaine 1% with epinephrine  Anesthetic total: 2 mL  Foreign bodies: no foreign bodies  Skin closure: 5-0 nylon  Number of sutures: 3  Technique: simple and interrupted  Approximation: close  Dressing: antibiotic ointment  Patient tolerance: Patient tolerated the procedure well with no immediate complications  My total time at bedside, performing this procedure was 1-15 minutes.    Consult note: Dr. Caryn Bee - looked at scan and does not feel the finding mentioned by the radiologist is significant.  Catalina Pizza, MD  7:35 PM    A/P: [facility] with head/facial trauma; knee sprain - no bleed on CT; + nasal Fx; Keflex; facial lac's; normal neuro exam; reassuring appearance  and exam; VSS; home with and PCP f/u.  Catalina Pizza, MD

## 2016-11-20 NOTE — ED Notes (Signed)
Right forearm and facial wounds cleansed with wound cleanser.

## 2016-11-20 NOTE — ED Notes (Signed)
Bedside shift change report given to K. Steffanie Dunn RN (Cabin crew) by Judie Petit. Shawnie Dapper RN Physiological scientist). Report included the following information SBAR,MAR,vitals,all test results.Marland Kitchen

## 2016-12-12 ENCOUNTER — Ambulatory Visit (INDEPENDENT_AMBULATORY_CARE_PROVIDER_SITE_OTHER): Payer: Medicare Other | Admitting: Ophthalmology

## 2016-12-12 DIAGNOSIS — E113293 Type 2 diabetes mellitus with mild nonproliferative diabetic retinopathy without macular edema, bilateral: Secondary | ICD-10-CM

## 2016-12-12 DIAGNOSIS — E11319 Type 2 diabetes mellitus with unspecified diabetic retinopathy without macular edema: Secondary | ICD-10-CM | POA: Diagnosis not present

## 2016-12-12 DIAGNOSIS — H35033 Hypertensive retinopathy, bilateral: Secondary | ICD-10-CM

## 2016-12-12 DIAGNOSIS — I10 Essential (primary) hypertension: Secondary | ICD-10-CM | POA: Diagnosis not present

## 2016-12-12 DIAGNOSIS — H43813 Vitreous degeneration, bilateral: Secondary | ICD-10-CM

## 2016-12-12 DIAGNOSIS — H2513 Age-related nuclear cataract, bilateral: Secondary | ICD-10-CM

## 2017-01-09 ENCOUNTER — Other Ambulatory Visit: Payer: Self-pay | Admitting: Internal Medicine

## 2017-01-09 DIAGNOSIS — Z1231 Encounter for screening mammogram for malignant neoplasm of breast: Secondary | ICD-10-CM

## 2017-01-28 ENCOUNTER — Ambulatory Visit: Payer: Managed Care, Other (non HMO)

## 2017-03-10 ENCOUNTER — Ambulatory Visit: Payer: Medicare Other

## 2017-05-12 ENCOUNTER — Ambulatory Visit
Admission: RE | Admit: 2017-05-12 | Discharge: 2017-05-12 | Disposition: A | Discharge status: Home / Self Care | Payer: Medicare Other | Source: Ambulatory Visit | Attending: Internal Medicine | Admitting: Internal Medicine

## 2017-05-12 DIAGNOSIS — Z1231 Encounter for screening mammogram for malignant neoplasm of breast: Secondary | ICD-10-CM

## 2017-12-12 ENCOUNTER — Encounter (INDEPENDENT_AMBULATORY_CARE_PROVIDER_SITE_OTHER): Payer: Medicare Other | Admitting: Ophthalmology

## 2017-12-30 ENCOUNTER — Encounter (INDEPENDENT_AMBULATORY_CARE_PROVIDER_SITE_OTHER): Payer: Medicare Other | Admitting: Ophthalmology

## 2017-12-30 DIAGNOSIS — E113293 Type 2 diabetes mellitus with mild nonproliferative diabetic retinopathy without macular edema, bilateral: Secondary | ICD-10-CM

## 2017-12-30 DIAGNOSIS — H35033 Hypertensive retinopathy, bilateral: Secondary | ICD-10-CM | POA: Diagnosis not present

## 2017-12-30 DIAGNOSIS — E11319 Type 2 diabetes mellitus with unspecified diabetic retinopathy without macular edema: Secondary | ICD-10-CM

## 2017-12-30 DIAGNOSIS — H43813 Vitreous degeneration, bilateral: Secondary | ICD-10-CM

## 2017-12-30 DIAGNOSIS — I10 Essential (primary) hypertension: Secondary | ICD-10-CM | POA: Diagnosis not present

## 2017-12-30 DIAGNOSIS — H2513 Age-related nuclear cataract, bilateral: Secondary | ICD-10-CM

## 2019-01-04 ENCOUNTER — Other Ambulatory Visit: Payer: Self-pay

## 2019-01-04 ENCOUNTER — Encounter (INDEPENDENT_AMBULATORY_CARE_PROVIDER_SITE_OTHER): Payer: Medicare Other | Admitting: Ophthalmology

## 2019-01-04 DIAGNOSIS — H43813 Vitreous degeneration, bilateral: Secondary | ICD-10-CM

## 2019-01-04 DIAGNOSIS — E113293 Type 2 diabetes mellitus with mild nonproliferative diabetic retinopathy without macular edema, bilateral: Secondary | ICD-10-CM

## 2019-01-04 DIAGNOSIS — H35033 Hypertensive retinopathy, bilateral: Secondary | ICD-10-CM | POA: Diagnosis not present

## 2019-01-04 DIAGNOSIS — E11319 Type 2 diabetes mellitus with unspecified diabetic retinopathy without macular edema: Secondary | ICD-10-CM

## 2019-01-04 DIAGNOSIS — I10 Essential (primary) hypertension: Secondary | ICD-10-CM

## 2020-01-10 ENCOUNTER — Encounter (INDEPENDENT_AMBULATORY_CARE_PROVIDER_SITE_OTHER): Payer: Medicare Other | Admitting: Ophthalmology

## 2020-02-21 ENCOUNTER — Encounter (INDEPENDENT_AMBULATORY_CARE_PROVIDER_SITE_OTHER): Payer: Medicare Other | Admitting: Ophthalmology

## 2020-04-03 ENCOUNTER — Encounter (INDEPENDENT_AMBULATORY_CARE_PROVIDER_SITE_OTHER): Payer: Medicare Other | Admitting: Ophthalmology

## 2020-07-26 ENCOUNTER — Other Ambulatory Visit: Payer: Self-pay | Admitting: Internal Medicine

## 2020-07-26 DIAGNOSIS — E7800 Pure hypercholesterolemia, unspecified: Secondary | ICD-10-CM

## 2020-08-03 ENCOUNTER — Ambulatory Visit
Admission: RE | Admit: 2020-08-03 | Discharge: 2020-08-03 | Disposition: A | Discharge status: Home / Self Care | Payer: Medicare Other | Source: Ambulatory Visit | Attending: Internal Medicine | Admitting: Internal Medicine

## 2020-08-03 ENCOUNTER — Other Ambulatory Visit: Payer: Self-pay | Admitting: Internal Medicine

## 2020-08-03 DIAGNOSIS — E7800 Pure hypercholesterolemia, unspecified: Secondary | ICD-10-CM

## 2021-02-02 ENCOUNTER — Other Ambulatory Visit: Payer: Self-pay | Admitting: Internal Medicine

## 2021-02-02 DIAGNOSIS — M8580 Other specified disorders of bone density and structure, unspecified site: Secondary | ICD-10-CM

## 2021-08-13 ENCOUNTER — Ambulatory Visit
Admission: RE | Admit: 2021-08-13 | Discharge: 2021-08-13 | Disposition: A | Discharge status: Home / Self Care | Payer: Medicare Other | Source: Ambulatory Visit | Attending: Internal Medicine | Admitting: Internal Medicine

## 2021-08-13 DIAGNOSIS — M8580 Other specified disorders of bone density and structure, unspecified site: Secondary | ICD-10-CM

## 2021-09-18 ENCOUNTER — Other Ambulatory Visit: Payer: Self-pay | Admitting: Gastroenterology

## 2021-12-25 ENCOUNTER — Ambulatory Visit (HOSPITAL_COMMUNITY): Admit: 2021-12-25 | Payer: Medicare Other | Admitting: Gastroenterology

## 2021-12-25 ENCOUNTER — Encounter (HOSPITAL_COMMUNITY): Payer: Self-pay

## 2021-12-25 SURGERY — COLONOSCOPY WITH PROPOFOL
Anesthesia: Monitor Anesthesia Care | Laterality: Bilateral

## 2024-01-16 NOTE — Progress Notes (Addendum)
 Jillian Sanders                                          MRN: [id number]   04/05/2024   The [facility] Quality Team Specialist reviewed this patient medical record for the purposes of chart review for care gap closure. The following were reviewed: chart review for care gap closure-glycemic status assessment & BCS, EED.    [facility] Quality Team

## 2024-03-03 ENCOUNTER — Other Ambulatory Visit (HOSPITAL_BASED_OUTPATIENT_CLINIC_OR_DEPARTMENT_OTHER): Payer: Self-pay | Admitting: Internal Medicine

## 2024-03-03 DIAGNOSIS — M810 Age-related osteoporosis without current pathological fracture: Secondary | ICD-10-CM
